# Patient Record
Sex: Female | Born: 1940 | Race: Black or African American | Hispanic: No | Marital: Single | Smoking: Former smoker
Health system: Southern US, Community
[De-identification: ages and names within clinical notes are randomized; demographics above are authoritative.]

## PROBLEM LIST (undated history)

## (undated) DIAGNOSIS — Z87448 Personal history of other diseases of urinary system: Secondary | ICD-10-CM

## (undated) DIAGNOSIS — R0602 Shortness of breath: Secondary | ICD-10-CM

## (undated) DIAGNOSIS — E785 Hyperlipidemia, unspecified: Secondary | ICD-10-CM

## (undated) DIAGNOSIS — C189 Malignant neoplasm of colon, unspecified: Secondary | ICD-10-CM

## (undated) DIAGNOSIS — Z8669 Personal history of other diseases of the nervous system and sense organs: Secondary | ICD-10-CM

## (undated) DIAGNOSIS — K635 Polyp of colon: Secondary | ICD-10-CM

## (undated) DIAGNOSIS — K529 Noninfective gastroenteritis and colitis, unspecified: Secondary | ICD-10-CM

## (undated) DIAGNOSIS — Z8709 Personal history of other diseases of the respiratory system: Secondary | ICD-10-CM

## (undated) DIAGNOSIS — I10 Essential (primary) hypertension: Secondary | ICD-10-CM

## (undated) DIAGNOSIS — B029 Zoster without complications: Secondary | ICD-10-CM

## (undated) DIAGNOSIS — C37 Malignant neoplasm of thymus: Secondary | ICD-10-CM

## (undated) HISTORY — DX: Personal history of other diseases of the nervous system and sense organs: Z86.69

## (undated) HISTORY — DX: Personal history of other diseases of urinary system: Z87.448

## (undated) HISTORY — DX: Personal history of other diseases of the respiratory system: Z87.09

## (undated) HISTORY — DX: Malignant neoplasm of colon, unspecified: C18.9

## (undated) HISTORY — DX: Polyp of colon: K63.5

## (undated) HISTORY — DX: Noninfective gastroenteritis and colitis, unspecified: K52.9

## (undated) HISTORY — DX: Essential (primary) hypertension: I10

## (undated) HISTORY — DX: Hyperlipidemia, unspecified: E78.5

## (undated) HISTORY — DX: Zoster without complications: B02.9

## (undated) HISTORY — DX: Malignant neoplasm of thymus: C37

---

## 1999-04-02 ENCOUNTER — Encounter: Admission: RE | Admit: 1999-04-02 | Discharge: 1999-04-02 | Payer: Self-pay | Admitting: Endocrinology

## 1999-05-25 ENCOUNTER — Encounter (INDEPENDENT_AMBULATORY_CARE_PROVIDER_SITE_OTHER): Payer: Self-pay | Admitting: *Deleted

## 1999-05-25 ENCOUNTER — Ambulatory Visit (HOSPITAL_COMMUNITY): Admission: RE | Admit: 1999-05-25 | Discharge: 1999-05-25 | Payer: Self-pay | Admitting: Gastroenterology

## 1999-05-28 ENCOUNTER — Encounter: Admission: RE | Admit: 1999-05-28 | Discharge: 1999-05-28 | Payer: Self-pay | Admitting: Gastroenterology

## 1999-05-28 ENCOUNTER — Encounter: Payer: Self-pay | Admitting: Gastroenterology

## 1999-06-21 HISTORY — PX: COLON SURGERY: SHX602

## 1999-06-21 HISTORY — PX: GALLBLADDER SURGERY: SHX652

## 1999-06-25 ENCOUNTER — Encounter: Payer: Self-pay | Admitting: Surgery

## 1999-06-29 ENCOUNTER — Encounter (INDEPENDENT_AMBULATORY_CARE_PROVIDER_SITE_OTHER): Payer: Self-pay | Admitting: Specialist

## 1999-06-29 ENCOUNTER — Inpatient Hospital Stay (HOSPITAL_COMMUNITY): Admission: RE | Admit: 1999-06-29 | Discharge: 1999-07-10 | Payer: Self-pay | Admitting: Surgery

## 1999-07-07 ENCOUNTER — Encounter: Payer: Self-pay | Admitting: Surgery

## 1999-07-08 ENCOUNTER — Encounter: Payer: Self-pay | Admitting: Surgery

## 2001-02-23 ENCOUNTER — Encounter: Payer: Self-pay | Admitting: Endocrinology

## 2001-02-23 ENCOUNTER — Encounter: Admission: RE | Admit: 2001-02-23 | Discharge: 2001-02-23 | Payer: Self-pay | Admitting: Sports Medicine

## 2002-05-15 ENCOUNTER — Encounter (INDEPENDENT_AMBULATORY_CARE_PROVIDER_SITE_OTHER): Payer: Self-pay | Admitting: Specialist

## 2002-05-15 ENCOUNTER — Ambulatory Visit (HOSPITAL_COMMUNITY): Admission: RE | Admit: 2002-05-15 | Discharge: 2002-05-15 | Payer: Self-pay | Admitting: Gastroenterology

## 2003-06-29 ENCOUNTER — Inpatient Hospital Stay (HOSPITAL_COMMUNITY): Admission: EM | Admit: 2003-06-29 | Discharge: 2003-07-01 | Payer: Self-pay | Admitting: Emergency Medicine

## 2005-08-15 ENCOUNTER — Ambulatory Visit (HOSPITAL_COMMUNITY): Admission: RE | Admit: 2005-08-15 | Discharge: 2005-08-15 | Payer: Self-pay | Admitting: Gastroenterology

## 2005-08-15 ENCOUNTER — Encounter (INDEPENDENT_AMBULATORY_CARE_PROVIDER_SITE_OTHER): Payer: Self-pay | Admitting: Specialist

## 2007-10-15 ENCOUNTER — Encounter: Admission: RE | Admit: 2007-10-15 | Discharge: 2007-10-15 | Payer: Self-pay | Admitting: Endocrinology

## 2009-04-17 ENCOUNTER — Ambulatory Visit: Payer: Self-pay | Admitting: Pulmonary Disease

## 2009-04-17 DIAGNOSIS — I10 Essential (primary) hypertension: Secondary | ICD-10-CM | POA: Insufficient documentation

## 2009-04-17 DIAGNOSIS — R0602 Shortness of breath: Secondary | ICD-10-CM

## 2009-05-12 ENCOUNTER — Ambulatory Visit: Payer: Self-pay | Admitting: Pulmonary Disease

## 2009-05-12 DIAGNOSIS — J439 Emphysema, unspecified: Secondary | ICD-10-CM

## 2009-05-18 ENCOUNTER — Encounter: Payer: Self-pay | Admitting: Pulmonary Disease

## 2009-06-15 ENCOUNTER — Encounter: Payer: Self-pay | Admitting: Pulmonary Disease

## 2009-09-04 ENCOUNTER — Telehealth (INDEPENDENT_AMBULATORY_CARE_PROVIDER_SITE_OTHER): Payer: Self-pay | Admitting: *Deleted

## 2009-09-15 ENCOUNTER — Encounter (HOSPITAL_COMMUNITY): Admission: RE | Admit: 2009-09-15 | Discharge: 2009-12-17 | Payer: Self-pay | Admitting: Pulmonary Disease

## 2009-09-17 ENCOUNTER — Ambulatory Visit: Payer: Self-pay | Admitting: Pulmonary Disease

## 2009-09-23 ENCOUNTER — Encounter: Payer: Self-pay | Admitting: Pulmonary Disease

## 2009-11-24 ENCOUNTER — Encounter: Payer: Self-pay | Admitting: Pulmonary Disease

## 2009-12-16 ENCOUNTER — Encounter: Payer: Self-pay | Admitting: Pulmonary Disease

## 2009-12-22 ENCOUNTER — Encounter (HOSPITAL_COMMUNITY): Admission: RE | Admit: 2009-12-22 | Discharge: 2010-03-19 | Payer: Self-pay | Admitting: Pulmonary Disease

## 2010-02-09 ENCOUNTER — Encounter: Payer: Self-pay | Admitting: Pulmonary Disease

## 2010-02-26 ENCOUNTER — Encounter: Admission: RE | Admit: 2010-02-26 | Discharge: 2010-02-26 | Payer: Self-pay | Admitting: Endocrinology

## 2010-03-10 ENCOUNTER — Encounter (INDEPENDENT_AMBULATORY_CARE_PROVIDER_SITE_OTHER): Payer: Self-pay | Admitting: *Deleted

## 2010-03-10 DIAGNOSIS — R509 Fever, unspecified: Secondary | ICD-10-CM | POA: Insufficient documentation

## 2010-03-12 ENCOUNTER — Encounter (INDEPENDENT_AMBULATORY_CARE_PROVIDER_SITE_OTHER): Payer: Self-pay | Admitting: *Deleted

## 2010-03-15 ENCOUNTER — Encounter: Payer: Self-pay | Admitting: Internal Medicine

## 2010-03-15 DIAGNOSIS — H409 Unspecified glaucoma: Secondary | ICD-10-CM | POA: Insufficient documentation

## 2010-03-15 DIAGNOSIS — Z9089 Acquired absence of other organs: Secondary | ICD-10-CM | POA: Insufficient documentation

## 2010-03-15 DIAGNOSIS — Z860100 Personal history of colon polyps, unspecified: Secondary | ICD-10-CM | POA: Insufficient documentation

## 2010-03-15 DIAGNOSIS — E785 Hyperlipidemia, unspecified: Secondary | ICD-10-CM

## 2010-03-15 DIAGNOSIS — Z85038 Personal history of other malignant neoplasm of large intestine: Secondary | ICD-10-CM | POA: Insufficient documentation

## 2010-03-15 LAB — CONVERTED CEMR LAB
AST: 24 units/L (ref 0–37)
Albumin: 3.8 g/dL (ref 3.5–5.2)
Alkaline Phosphatase: 76 units/L (ref 39–117)
Basophils Absolute: 0.1 10*3/uL (ref 0.0–0.1)
Basophils Relative: 0 % (ref 0–1)
Bilirubin Urine: NEGATIVE
Casts: NONE SEEN /lpf
Hemoglobin, Urine: NEGATIVE
MCHC: 33 g/dL (ref 30.0–36.0)
Monocytes Relative: 9 % (ref 3–12)
Neutro Abs: 10.5 10*3/uL — ABNORMAL HIGH (ref 1.7–7.7)
Neutrophils Relative %: 77 % (ref 43–77)
Potassium: 3.4 meq/L — ABNORMAL LOW (ref 3.5–5.3)
Protein, ur: NEGATIVE mg/dL
RBC: 4.24 M/uL (ref 3.87–5.11)
RDW: 12.3 % (ref 11.5–15.5)
Sed Rate: 66 mm/hr — ABNORMAL HIGH (ref 0–22)
Sodium: 137 meq/L (ref 135–145)
Total Protein: 7.1 g/dL (ref 6.0–8.3)
Urine Glucose: NEGATIVE mg/dL
WBC, UA: >50 cells/hpf — AB (ref <3)

## 2010-03-18 ENCOUNTER — Telehealth: Payer: Self-pay | Admitting: Internal Medicine

## 2010-03-18 DIAGNOSIS — K5289 Other specified noninfective gastroenteritis and colitis: Secondary | ICD-10-CM

## 2010-03-26 ENCOUNTER — Ambulatory Visit: Payer: Self-pay | Admitting: Pulmonary Disease

## 2010-03-31 ENCOUNTER — Telehealth: Payer: Self-pay | Admitting: Internal Medicine

## 2010-04-15 ENCOUNTER — Telehealth (INDEPENDENT_AMBULATORY_CARE_PROVIDER_SITE_OTHER): Payer: Self-pay | Admitting: *Deleted

## 2010-04-28 ENCOUNTER — Encounter: Payer: Self-pay | Admitting: Internal Medicine

## 2010-04-29 ENCOUNTER — Inpatient Hospital Stay (HOSPITAL_COMMUNITY): Admission: EM | Admit: 2010-04-29 | Discharge: 2010-05-14 | Payer: Self-pay | Admitting: Internal Medicine

## 2010-04-29 ENCOUNTER — Ambulatory Visit: Payer: Self-pay | Admitting: Internal Medicine

## 2010-04-29 ENCOUNTER — Encounter: Payer: Self-pay | Admitting: Internal Medicine

## 2010-04-29 DIAGNOSIS — R634 Abnormal weight loss: Secondary | ICD-10-CM

## 2010-04-29 DIAGNOSIS — R1310 Dysphagia, unspecified: Secondary | ICD-10-CM | POA: Insufficient documentation

## 2010-05-04 ENCOUNTER — Encounter: Payer: Self-pay | Admitting: Internal Medicine

## 2010-05-04 ENCOUNTER — Ambulatory Visit: Payer: Self-pay | Admitting: Cardiothoracic Surgery

## 2010-05-07 ENCOUNTER — Ambulatory Visit: Payer: Self-pay | Admitting: Hematology & Oncology

## 2010-05-10 ENCOUNTER — Encounter: Payer: Self-pay | Admitting: Internal Medicine

## 2010-05-10 HISTORY — PX: OTHER SURGICAL HISTORY: SHX169

## 2010-05-21 ENCOUNTER — Ambulatory Visit: Payer: Self-pay | Admitting: Cardiothoracic Surgery

## 2010-05-25 ENCOUNTER — Ambulatory Visit
Admission: RE | Admit: 2010-05-25 | Discharge: 2010-07-20 | Payer: Self-pay | Source: Home / Self Care | Attending: Radiation Oncology | Admitting: Radiation Oncology

## 2010-05-27 ENCOUNTER — Encounter: Payer: Self-pay | Admitting: Internal Medicine

## 2010-05-27 ENCOUNTER — Encounter
Admission: RE | Admit: 2010-05-27 | Discharge: 2010-05-27 | Payer: Self-pay | Source: Home / Self Care | Admitting: Cardiothoracic Surgery

## 2010-05-27 ENCOUNTER — Ambulatory Visit: Payer: Self-pay | Admitting: Cardiothoracic Surgery

## 2010-07-02 ENCOUNTER — Ambulatory Visit: Payer: Self-pay | Admitting: Hematology & Oncology

## 2010-07-07 ENCOUNTER — Telehealth (INDEPENDENT_AMBULATORY_CARE_PROVIDER_SITE_OTHER): Payer: Self-pay | Admitting: *Deleted

## 2010-07-09 ENCOUNTER — Encounter
Admission: RE | Admit: 2010-07-09 | Discharge: 2010-07-09 | Payer: Self-pay | Source: Home / Self Care | Attending: Cardiothoracic Surgery | Admitting: Cardiothoracic Surgery

## 2010-07-09 ENCOUNTER — Ambulatory Visit
Admission: RE | Admit: 2010-07-09 | Discharge: 2010-07-09 | Payer: Self-pay | Source: Home / Self Care | Attending: Cardiothoracic Surgery | Admitting: Cardiothoracic Surgery

## 2010-07-10 ENCOUNTER — Other Ambulatory Visit: Payer: Self-pay | Admitting: Hematology & Oncology

## 2010-07-10 DIAGNOSIS — C73 Malignant neoplasm of thyroid gland: Secondary | ICD-10-CM

## 2010-07-10 NOTE — Assessment & Plan Note (Addendum)
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  The patient is currently undergoing radiation therapy and followup to her thymic carcinoma.  She has been somewhat weak during this.  PHYSICAL EXAMINATION:  Blood pressure today is 88/68.  When she first walked into the office, the heart rate was 140, dropped to 116.  EKG rhythm strip showed sinus rhythm.  Respiratory rate 16.  O2 sats 97%. The patient remains somewhat weak, notes that she has not been eating much, but continues to take her medication.  Her partial sternotomy incision is well healed without evidence of infection.  She has no evidence of DVT or pedal edema.  IMPRESSION:  I have reviewed her medication.  She did see Dr. Roselind Messier in Radiation and started on Megace to help her appetite.  With her low blood pressure, she continues on verapamil SR 120 mg a day.  She has a blood pressure cuff she uses at home and I have told her that if blood pressure is less than 90, not to take her verapamil.  She is to make an appointment to see Dr. Lucianne Muss to review all of her medications.  She does have a history of renal insufficiency and with her low blood pressure probably needs medications adjusted.  I plan to see her back at the completion of her radiation therapy after she has a followup CT scan by Dr. Myna Hidalgo.  Sheliah Plane, MD Electronically Signed  EG/MEDQ  D:  07/09/2010  T:  07/10/2010  Job:  [id number]  cc:   [name], MD Rose Phi. Myna Hidalgo, M.D.

## 2010-07-18 ENCOUNTER — Emergency Department (HOSPITAL_COMMUNITY)
Admission: EM | Admit: 2010-07-18 | Discharge: 2010-07-19 | Payer: Self-pay | Source: Home / Self Care | Admitting: Emergency Medicine

## 2010-07-19 LAB — URINALYSIS, ROUTINE W REFLEX MICROSCOPIC
Leukocytes, UA: NEGATIVE
Nitrite: NEGATIVE
Specific Gravity, Urine: 1.019 (ref 1.005–1.030)
Urobilinogen, UA: 1 mg/dL (ref 0.0–1.0)
pH: 6 (ref 5.0–8.0)

## 2010-07-19 LAB — DIFFERENTIAL
Basophils Absolute: 0 10*3/uL (ref 0.0–0.1)
Basophils Relative: 0 % (ref 0–1)
Eosinophils Absolute: 0.2 10*3/uL (ref 0.0–0.7)
Monocytes Absolute: 0.5 10*3/uL (ref 0.1–1.0)
Neutro Abs: 11.4 10*3/uL — ABNORMAL HIGH (ref 1.7–7.7)
Neutrophils Relative %: 90 % — ABNORMAL HIGH (ref 43–77)

## 2010-07-19 LAB — BASIC METABOLIC PANEL
CO2: 22 mEq/L (ref 19–32)
Calcium: 9 mg/dL (ref 8.4–10.5)
Chloride: 99 mEq/L (ref 96–112)
Creatinine, Ser: 2.58 mg/dL — ABNORMAL HIGH (ref 0.4–1.2)
GFR calc Af Amer: 22 mL/min — ABNORMAL LOW (ref >60)
Sodium: 134 mEq/L — ABNORMAL LOW (ref 135–145)

## 2010-07-19 LAB — POCT I-STAT, CHEM 8
Chloride: 100 mEq/L (ref 96–112)
Glucose, Bld: 100 mg/dL — ABNORMAL HIGH (ref 70–99)
HCT: 30 % — ABNORMAL LOW (ref 36.0–46.0)
Hemoglobin: 10.2 g/dL — ABNORMAL LOW (ref 12.0–15.0)
Potassium: 3.3 mEq/L — ABNORMAL LOW (ref 3.5–5.1)
Sodium: 133 mEq/L — ABNORMAL LOW (ref 135–145)

## 2010-07-19 LAB — CBC
Hemoglobin: 10.3 g/dL — ABNORMAL LOW (ref 12.0–15.0)
MCHC: 33.3 g/dL (ref 30.0–36.0)
Platelets: 481 10*3/uL — ABNORMAL HIGH (ref 150–400)

## 2010-07-19 LAB — URINE MICROSCOPIC-ADD ON

## 2010-07-20 ENCOUNTER — Inpatient Hospital Stay (HOSPITAL_COMMUNITY)
Admission: EM | Admit: 2010-07-20 | Discharge: 2010-07-29 | DRG: 644 | Disposition: A | Discharge status: Home / Self Care | Payer: Medicaid Other | Attending: Family Medicine | Admitting: Family Medicine

## 2010-07-20 DIAGNOSIS — K5190 Ulcerative colitis, unspecified, without complications: Secondary | ICD-10-CM | POA: Diagnosis present

## 2010-07-20 DIAGNOSIS — D631 Anemia in chronic kidney disease: Secondary | ICD-10-CM | POA: Diagnosis present

## 2010-07-20 DIAGNOSIS — J4489 Other specified chronic obstructive pulmonary disease: Secondary | ICD-10-CM | POA: Diagnosis present

## 2010-07-20 DIAGNOSIS — R197 Diarrhea, unspecified: Secondary | ICD-10-CM | POA: Diagnosis not present

## 2010-07-20 DIAGNOSIS — I959 Hypotension, unspecified: Secondary | ICD-10-CM | POA: Diagnosis present

## 2010-07-20 DIAGNOSIS — E785 Hyperlipidemia, unspecified: Secondary | ICD-10-CM | POA: Diagnosis present

## 2010-07-20 DIAGNOSIS — E872 Acidosis, unspecified: Secondary | ICD-10-CM | POA: Diagnosis not present

## 2010-07-20 DIAGNOSIS — T380X5A Adverse effect of glucocorticoids and synthetic analogues, initial encounter: Secondary | ICD-10-CM | POA: Diagnosis present

## 2010-07-20 DIAGNOSIS — E860 Dehydration: Secondary | ICD-10-CM | POA: Diagnosis present

## 2010-07-20 DIAGNOSIS — I498 Other specified cardiac arrhythmias: Secondary | ICD-10-CM | POA: Diagnosis present

## 2010-07-20 DIAGNOSIS — E041 Nontoxic single thyroid nodule: Secondary | ICD-10-CM | POA: Diagnosis present

## 2010-07-20 DIAGNOSIS — Z79899 Other long term (current) drug therapy: Secondary | ICD-10-CM

## 2010-07-20 DIAGNOSIS — E876 Hypokalemia: Secondary | ICD-10-CM | POA: Diagnosis present

## 2010-07-20 DIAGNOSIS — K5900 Constipation, unspecified: Secondary | ICD-10-CM | POA: Diagnosis present

## 2010-07-20 DIAGNOSIS — F29 Unspecified psychosis not due to a substance or known physiological condition: Secondary | ICD-10-CM | POA: Diagnosis not present

## 2010-07-20 DIAGNOSIS — Z85038 Personal history of other malignant neoplasm of large intestine: Secondary | ICD-10-CM

## 2010-07-20 DIAGNOSIS — Z9049 Acquired absence of other specified parts of digestive tract: Secondary | ICD-10-CM

## 2010-07-20 DIAGNOSIS — N179 Acute kidney failure, unspecified: Secondary | ICD-10-CM | POA: Diagnosis present

## 2010-07-20 DIAGNOSIS — N390 Urinary tract infection, site not specified: Secondary | ICD-10-CM | POA: Diagnosis not present

## 2010-07-20 DIAGNOSIS — Y842 Radiological procedure and radiotherapy as the cause of abnormal reaction of the patient, or of later complication, without mention of misadventure at the time of the procedure: Secondary | ICD-10-CM | POA: Diagnosis present

## 2010-07-20 DIAGNOSIS — I2489 Other forms of acute ischemic heart disease: Secondary | ICD-10-CM | POA: Diagnosis not present

## 2010-07-20 DIAGNOSIS — I248 Other forms of acute ischemic heart disease: Secondary | ICD-10-CM | POA: Diagnosis not present

## 2010-07-20 DIAGNOSIS — D509 Iron deficiency anemia, unspecified: Secondary | ICD-10-CM | POA: Diagnosis present

## 2010-07-20 DIAGNOSIS — R5381 Other malaise: Secondary | ICD-10-CM | POA: Diagnosis not present

## 2010-07-20 DIAGNOSIS — I129 Hypertensive chronic kidney disease with stage 1 through stage 4 chronic kidney disease, or unspecified chronic kidney disease: Secondary | ICD-10-CM | POA: Diagnosis present

## 2010-07-20 DIAGNOSIS — E2749 Other adrenocortical insufficiency: Principal | ICD-10-CM | POA: Diagnosis present

## 2010-07-20 DIAGNOSIS — Z87891 Personal history of nicotine dependence: Secondary | ICD-10-CM

## 2010-07-20 DIAGNOSIS — R627 Adult failure to thrive: Secondary | ICD-10-CM | POA: Diagnosis present

## 2010-07-20 DIAGNOSIS — J449 Chronic obstructive pulmonary disease, unspecified: Secondary | ICD-10-CM | POA: Diagnosis present

## 2010-07-20 DIAGNOSIS — C37 Malignant neoplasm of thymus: Secondary | ICD-10-CM | POA: Diagnosis present

## 2010-07-20 DIAGNOSIS — N183 Chronic kidney disease, stage 3 unspecified: Secondary | ICD-10-CM | POA: Diagnosis present

## 2010-07-20 HISTORY — DX: Shortness of breath: R06.02

## 2010-07-20 LAB — URINE CULTURE
Culture  Setup Time: 201201301142
Culture: NO GROWTH

## 2010-07-20 LAB — CBC
Hemoglobin: 12.9 g/dL (ref 12.0–15.0)
MCHC: 33.7 g/dL (ref 30.0–36.0)
RDW: 15.9 % — ABNORMAL HIGH (ref 11.5–15.5)
WBC: 9.8 10*3/uL (ref 4.0–10.5)

## 2010-07-20 LAB — DIFFERENTIAL
Basophils Absolute: 0 10*3/uL (ref 0.0–0.1)
Basophils Relative: 0 % (ref 0–1)
Lymphocytes Relative: 5 % — ABNORMAL LOW (ref 12–46)
Monocytes Absolute: 0.4 10*3/uL (ref 0.1–1.0)
Neutro Abs: 8.8 10*3/uL — ABNORMAL HIGH (ref 1.7–7.7)

## 2010-07-20 LAB — COMPREHENSIVE METABOLIC PANEL
Albumin: 2.4 g/dL — ABNORMAL LOW (ref 3.5–5.2)
Alkaline Phosphatase: 71 U/L (ref 39–117)
BUN: 21 mg/dL (ref 6–23)
CO2: 22 mEq/L (ref 19–32)
Chloride: 98 mEq/L (ref 96–112)
Creatinine, Ser: 2.19 mg/dL — ABNORMAL HIGH (ref 0.4–1.2)
GFR calc non Af Amer: 22 mL/min — ABNORMAL LOW (ref >60)
Glucose, Bld: 110 mg/dL — ABNORMAL HIGH (ref 70–99)
Potassium: 2.9 mEq/L — ABNORMAL LOW (ref 3.5–5.1)
Total Bilirubin: 0.8 mg/dL (ref 0.3–1.2)

## 2010-07-20 NOTE — Progress Notes (Signed)
Summary: Periodic fevers continue, appt. scheduled 04/29/10  Phone Note Call from Patient Call back at Home Phone [phone]   Caller: Patient Call For: Dewayne Shorter, MD Reason for Call: Acute Illness, Talk to Nurse Action Taken: Phone Call Completed, Appt Scheduled Summary of Call: Pt. continuing to have elevated fevers several times a week up to 102.2.  She does take Tylenol when this happens.  RN asked to to keep a diary of these fevers and to bring it to the next appt. on 04/29/10 @ 11:30.  She said that she would try to keep this record. [name] RN  April 15, 2010 3:36 PM

## 2010-07-20 NOTE — Miscellaneous (Signed)
Summary: Spiriva shipment arrived in mail  Clinical Lists Changes    pt's 90 day spiriva shipment arrived from pt assistance program. spoke wiht pt's family member and informed her meds left at front desk for pt to pick up.  family member verbalized understanding and stated she will relay message to pt.  Arman Filter LPN  November 24, 6008 2:37 PM

## 2010-07-20 NOTE — Miscellaneous (Signed)
Summary: shipment of Spiriva   Clinical Lists Changes   received shipment from [employer] on pt's Spiriva- 90 day supply.  pt aware med left at front desk for pt to pick up.  Aundra Millet Reynolds LPN  February 09, 2010 3:06 PM

## 2010-07-20 NOTE — Miscellaneous (Signed)
Summary: HIPAA Restrictions  HIPAA Restrictions   Imported By: Florinda Marker 05/03/2010 15:26:06  _____________________________________________________________________  External Attachment:    Type:   Image     Comment:   External Document

## 2010-07-20 NOTE — H&P (Signed)
[name], TRUXILLO NO.:  [id number]  MEDICAL RECORD NO.:  [id number]          PATIENT TYPE:  EMS  LOCATION:  ED                           FACILITY:  [facility]  PHYSICIAN:  Andreas Blower, MD       DATE OF BIRTH:  [birth year]  DATE OF ADMISSION:  07/20/2010 DATE OF DISCHARGE:                             HISTORY & PHYSICAL   PRIMARY CARE PHYSICIAN:  Dr. Lucianne Muss.  GASTROENTEROLOGIST:  Anselmo Rod, MD, [facility].  THORACIC SURGEON:  Sheliah Plane, M.D.  NEPHROLOGIST:  Terrial Rhodes, M.D.  HEMATOLOGIST/ONCOLOGIST:  Rose Phi. Myna Hidalgo, M.D.  CHIEF COMPLAINT:  Weakness, abdominal pain.  HISTORY OF PRESENT ILLNESS:  Ms. [name] is a 70 year old African American female with a history of chronic kidney disease, thymic carcinoma status post resection in November 2011, hypertension, hyperlipidemia, anemia, thyroid nodule, ulcerative colitis who presents with the above complaints.  The patient reports that she had been receiving radiation therapy and subsequently since then has been progressively getting weaker.  She presented yesterday with lower quadrant abdominal pain.  She had x-rays at that time, which showed that she had normal bowel gas pattern, and it was thought that she had constipation and was given Fleet enema and magnesium citrate with improvement in her symptoms.  She presented back today reporting that still feeling constipated however and also having some mild lower abdomen discomfort.  When I evaluated the patient, she denies any abdominal pain and was eating her breakfast.  She denies any recent fevers, chills, nausea or vomiting.  Denies any chest pain.  Denies any shortness of breath presently.  Denies any abdominal pain.  Denies any headaches or vision changes.  REVIEW OF SYSTEMS:  All systems were reviewed with the patient, were positive as per HPI, otherwise all other systems are negative.  PAST MEDICAL HISTORY: 1. History of COPD. 2.  Hypertension. 3. 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 4. Chronic kidney disease stage 3. 5. Hypokalemia. 6. Hypertension. 7. Hyperlipidemia. 8. History of ulcerative colitis. 9. History of thyroid nodule. 10.Anemia of chronic disease. 11.History of colon cancer status post hemicolectomy in January 2012. 12.History of hypomagnesemia. 13.History of the distal esophageal stricture status post dilatation     on May 06, 2011.  SOCIAL HISTORY:  The patient does not smoke at this time, quit few years ago, is unsure how long she smoked for.  Drinks a beer once or twice a week. Denies any illegal drugs or substances.  Lives at home.  FAMILY HISTORY:  Significant for diabetes.  HOME MEDICATIONS: 1. Triamterene and hydrochlorothiazide 37.5/25 mg p.o. daily. 2. Pravastatin 40 mg daily. 3. Potassium chloride 20 mEq p.o. daily. 4. Align 4 mg p.o. daily. 5. Clonazepam 0.5 mg every 6 hours as needed for anxiety. 6. Mesalamine DR 1.2 g p.o. daily. 7. Timolol ophthalmic solution 0.5% 1 drop both eyes twice daily. 8. Albuterol inhaler 1-2 puffs every 4 hours as needed. 9. Symbicort 160/4.5 mcg 2 puffs twice daily. 10.Spiriva 18 mcg p.o. daily. 11.Pantoprazole 40 mg p.o. daily 12.Megestrol 40 mg p.o. daily. 13.Verapamil extended release 120 p.o. daily.  PHYSICAL EXAMINATION:  VITAL  SIGNS:  Temperature is 97.6, blood pressure is 121/73, heart rate 109 and on EKG was 101, respirations 16, satting at 100%on 2 L of oxygen. GENERAL:  The patient was alert and oriented, did not appear to be in any acute distress, was laying in bed comfortably. HEENT:  Extraocular motions are intact.  Pupils equal and round.  Had slightly dry mucous membranes. NECK:  Supple. HEART:  Regular with S1 and S2. LUNGS:  Clear to auscultation bilaterally. ABDOMEN:  Soft, nontender, nondistended.  Positive bowel sounds. EXTREMITIES:  The patient had good peripheral pulses with trace  edema. NEUROLOGIC:  Cranial nerves II-XII grossly intact.  Had 5/5 motor strength in upper as well as lower extremities.  RADIOLOGY/IMAGING: 1. The patient had abdominal x-ray on July 19, 2010, which showed     normal bowel gas pattern status post cholecystectomy, postoperative     changes of median sternotomy. 2. The patient had EKG which showed sinus tachycardia.  LABORATORY DATA:  CBC shows a white count of 9.8, hemoglobin 12.8, hematocrit 38.3, platelet count 437.  Electrolytes showed sodium of 132, potassium 2.8, chloride 98, CO2 22, BUN 21, creatinine 2.19.  Liver function tests normal except albumin is 2.4.  ASSESSMENT AND PLAN: 1. Acute renal failure on chronic kidney disease II, most likely     secondary to dehydration, improved with hydration from yesterday     today.  Continue IV hydration.  Reassess her renal function     tomorrow.  If not improved, consider getting renal ultrasound.  At     that time had a UA yesterday, which was negative for nitrites and     leukocytes. 2. Dehydration.  Continue IV fluids.  Most likely secondary to poor     p.o. intake. 3. Constipation.  The patient was started on a bowel regimen.  Has     MiraLax, docusates and we will also have her on p.r.n. Dulcolax     suppository as needed. 4. Tachycardia, most likely secondary to dehydration, improved with IV     hydration.  Continue IV hydration. 5. Thymic carcinoma status post resection.  The patient to continue to     receive radiation therapy. 6. Hypokalemia most likely secondary to poor p.o. intake.  Replace as     needed. 7. Failure to thrive, poor p.o. intake.  The patient currently on     Megace.  We will continue for now.  We will encourage p.o. intake,     and we will get nutrition consult for foods that she can take.  We     will also have her on Ensure. 8. Hypertension.  Holding triamterene and hydrochlorothiazide given     the patient has dehydration.  Continue verapamil with  hold     parameters. 9. Hyperlipidemia.  Continue statin. 10.History of ulcerative colitis.  Continue mesalamine. 11.Anemia of chronic disease.  Hemoglobin is stable at this     time, is not anemic at this time.  I suspect that the patient may     be hemoconcentrated from dehydration. 12.History of thyroid nodule, not an active issue at this time.     Continue outpatient management as per her primary care physician. 13.Code status.  The patient is full code.  This was discussed with     the patient and the patient's family at the time of admission. 14.Disposition.  If her renal function improves in the next 28-48     hours, consider discharging the patient at this time after  she has     a PT evaluation and if she feels better.  Time spent on admission, talking to the patient, talking to the patient's family and coordinating care was 1 hour.   Andreas Blower, MD   SR/MEDQ  D:  07/20/2010  T:  07/20/2010  Job:  [id number]  Electronically Signed by Wardell Heath Brean Carberry  on 07/20/2010 11:34:10 PM

## 2010-07-20 NOTE — Progress Notes (Signed)
Summary: samples  Phone Note Call from Patient   Caller: Patient Call For: clance Summary of Call: pt would like to know if she should continue using spiriva . she had samples only Initial call taken by: Rickard Patience,  September 04, 2009 1:54 PM  Follow-up for Phone Call        called and spoke with pt.  informed pt per last ov with KC on 05-12-2010, pt is to stay on Spiriva.   pt does need 4 month f/u appt with kc.  pt scheduled this today for 09-17-2009 at 2:30pm.  Arman Filter LPN  September 04, 2009 2:18 PM

## 2010-07-20 NOTE — Assessment & Plan Note (Signed)
Summary: f/u cont. fevers /dde  Current Problems:   WEIGHT LOSS, ABNORMAL (ICD-783.21) DYSPHAGIA UNSPECIFIED (ICD-787.20) FEVER UNSPECIFIED (ICD-780.60) HYPERLIPIDEMIA (ICD-272.4) EMPHYSEMA (ICD-492.8)   DYSPNEA (ICD-786.05) HYPERTENSION (ICD-401.9) COLITIS (ICD-558.9) GLAUCOMA (ICD-365.9) COLONIC POLYPS, HX OF (ICD-V12.72) COLON CANCER, HX OF (ICD-V10.05)   * LEFT HEMICOLECTOMY CHOLECYSTECTOMY, HX OF (ICD-V45.79)   Referring Provider:  Self Referral Primary Provider:  Dr. Lucianne Muss  CC:  has lost weight, having difficulty swallowing, feels like she gags when she tries to eat.  No appetite.  Ensure doesn't appeal to her, nauseated most of the time, and restless legs and cramping at night.  History of Present Illness: [name] is in for her follow-up visit.  I spoke with her after her last visit 6 weeks ago and she informed me that she was still having daily fevers up to 102.  She says that shehasis continued to monitor her temperatures and does not believe that she's had any fever or in the last two weeks. She has not had any chills or sweats.  She has however developed difficulty swallowing over the past two weeks.  She has never had this problem before.  She describes difficulty with solid food and pills which will cause her to choke.  She will occasionally notice a choking sensation when she tries to rinse her mouth out with water but primarily has difficulty with solids.  She has no pain with swallowing.  She will occasionally develop some nausea and vomit when she is trying to swallow and chokes.  She has continued to lose weight.  She has not had any new cough but thinks that she may have a little bit more dyspnea on exertion than baseline.  She has not had any problem with acid indigestion, abdominal pain, diarrhea or constipation.  She has not had any dysuria.  She has not noted any rash or itching.  She's not had any new dental problems.  Preventive Screening-Counseling &  Management  Alcohol-Tobacco     Alcohol drinks/day: 0     Smoking Status: quit     Year Quit: 2010  Caffeine-Diet-Exercise     Caffeine use/day: tea     Does Patient Exercise: no  Safety-Violence-Falls     Seat Belt Use: yes   Updated Prior Medication List: SPIRIVA HANDIHALER 18 MCG CAPS (TIOTROPIUM BROMIDE MONOHYDRATE) inhale contents of 1 capsule daily SYMBICORT 160-4.5 MCG/ACT AERO (BUDESONIDE-FORMOTEROL FUMARATE) 2 puffs two times a day PROAIR HFA 108 (90 BASE) MCG/ACT  AERS (ALBUTEROL SULFATE) 1-2 puffs every 4-6 hours as needed VERAPAMIL HCL CR 120 MG CR-TABS (VERAPAMIL HCL) Take 1 tablet by mouth once a day TRIAMTERENE-HCTZ 37.5-25 MG TABS (TRIAMTERENE-HCTZ) 1/2 tab by mouth once daily PRAVASTATIN SODIUM 40 MG TABS (PRAVASTATIN SODIUM) 1 once daily TIMOLOL MALEATE 0.5 % SOLN (TIMOLOL MALEATE) 1 drop in each eye two times a day LIALDA 1.2 GM TBEC (MESALAMINE) Take 1 tablet by mouth once a day CLONAZEPAM 0.5 MG TABS (CLONAZEPAM) take by mouth as needed PRILOSEC OTC 20 MG TBEC (OMEPRAZOLE MAGNESIUM) Take 1 tablet by mouth once a day * OTC ALLERGY TABLET   Current Allergies (reviewed today): No known allergies  Additional History Menstrual Status:  postmenopausal  Review of Systems       The patient complains of weight loss, hoarseness, and dyspnea on exertion.  The patient denies anorexia, fever, chest pain, prolonged cough, headaches, abdominal pain, severe indigestion/heartburn, suspicious skin lesions, and difficulty walking.    Vital Signs:  Patient profile:   70 year old  female Menstrual status:  postmenopausal Height:      66 inches (167.64 cm) Weight:      131.5 pounds (59.77 kg) BMI:     21.30 Temp:     98.0 degrees F (36.67 degrees C) oral Pulse rate:   134 / minute BP sitting:   123 / 81  (left arm) Cuff size:   regular  Vitals Entered By: April Maduro RN (April 29, 2010 11:32 AM) CC: has lost weight, having difficulty swallowing, feels like she  gags when she tries to eat.  No appetite.  Ensure doesn't appeal to her, nauseated most of the time, restless legs and cramping at night Is Patient Diabetic? No Pain Assessment Patient in pain? no      Nutritional Status BMI of 19 -24 = normal Nutritional Status Detail appetite "I don't have one, even tablets get stuck in ther throat.  Have you ever been in a relationship where you felt threatened, hurt or afraid?not asked    Does patient need assistance? Functional Status Self care Ambulation Normal     Menstrual Status postmenopausal   Physical Exam  General:  alert, underweight appearing, and pale.  her voice is hoarse Eyes:  vision grossly intact, corneas and lenses clear, and no injection.   Mouth:  pharynx pink and moist, no erythema, no exudates, and fair dentition.   Neck:  supple, full ROM, and no masses.   Lungs:  normal breath sounds, no crackles, and no wheezes.   Heart:  normal rate, regular rhythm, and no murmur.   Abdomen:  soft, non-tender, normal bowel sounds, and no masses.   Msk:  normal ROM, no joint tenderness, no joint swelling, no joint warmth, and no redness over joints.   Skin:  no rashes and no suspicious lesions.   Cervical Nodes:  no anterior cervical adenopathy and no posterior cervical adenopathy.   Axillary Nodes:  no R axillary adenopathy and no L axillary adenopathy.   Psych:  normally interactive, good eye contact, not anxious appearing, and not depressed appearing.     Impression & Recommendations:  Problem # 1:  WEIGHT LOSS, ABNORMAL (ICD-783.21)  [name] he has lost over 30 pounds in the last 7 months without trying, 22 of those pounds been lost in the last few weeks.  Her weight loss predates her new onset dysphagia and seems out of proportion to what I would expect with this problem.  I am not sure how the weight loss relates to her recent fevers but I think it is best that we admit her to the hospital for further evaluation.  I have  spoken to her primary care doctor, Dr. Maryelizabeth Kaufmann. He is in agreement with this plan.  Although it seems that her fevers have abated in the last few weeks, this still may be part of afebrile illness.  She had pyuria at the time of her last visit without any symptoms of a UTI.  She also had mild eosinophilia with an absolute eosinophil count of 800.  Blood cultures in September were negative but these were obtained while taking Cipro.  She recalls that she did feel better while she was on Cipro suggesting that it may have been suppressing a bacterial infection.  Orders: Est. Patient Level IV (81191)  Problem # 2:  DYSPHAGIA UNSPECIFIED (ICD-787.20)  She has new onset dysphagia to solids without a history of acid reflux symptoms.  She has seen Dr. Loreta Ave in the past due to her history of  colon cancer, colon polyps and focal active colitis ( I do not know the full history for colitis). We will need to ask Dr. Loreta Ave to evaluate her in the hospital.  Orders: Est. Patient Level IV (16109)  Medications Added to Medication List This Visit: 1)  Prilosec Otc 20 Mg Tbec (Omeprazole magnesium) .... Take 1 tablet by mouth once a day 2)  Otc Allergy Tablet   Patient Instructions: 1)  She will be admitted to our Medical Teaching service 2)  She will need CBC with diff, CMP, UA, BC x 2, PA and lat CXR and a GI evaluation  Appended Document: f/u cont. fevers /dde   Influenza Vaccine    Vaccine Type: Fluvax Non-MCR    Site: right deltoid    Mfr: novartis    Dose: 0.5 ml    Route: IM    Given by: April Maduro RN    Exp. Date: 09/19/2010    Lot #: [id number]  Flu Vaccine Consent Questions    Do you have a history of severe allergic reactions to this vaccine? no    Any prior history of allergic reactions to egg and/or gelatin? no    Do you have a sensitivity to the preservative Thimersol? no    Do you have a past history of Guillan-Barre Syndrome? no    Do you currently have an acute febrile illness?  no    Have you ever had a severe reaction to latex? no    Vaccine information given and explained to patient? yes    Are you currently pregnant? no

## 2010-07-20 NOTE — Miscellaneous (Signed)
Summary: clinical list update  Clinical Lists Changes  Problems: Added new problem of FEVER UNSPECIFIED (ICD-780.60) - persistent

## 2010-07-20 NOTE — Miscellaneous (Signed)
Summary: Hospital Admission 04/29/10  04/29/10 - Hospital Admission Provider: Deatra Robinson MD  INTERNAL MEDICINE ADMISSION HISTORY AND PHYSICAL  PCP: Dr. Lucianne Muss Attending: Dr. Mariea Stable First Contact: Christin Gethers MS IV [phone]) Second Contact: Dr. Denton Meek ([phone])  CC: dysphagia  HPI: [name] is a 70 yo woman with PMHx of UC, colon cancer s/p L hemicolectomy, and emphysema  who presented to Dr. Blair Dolphin clinic today for a follow up appointe regarding FUO with chief complaints of dysphagia and weight loss.  Approximately 6 weeks ago ago the patient began to lose her appetite and experienced some associated stomach pain.  As a result the patient stayed at home, over time the stomach pain resolved. However, she never regained her appetite.  Throughout this time period, the patient began experiencing daily fevers of approximately 102 degrees.  The patient denies having fevers over the past 2 weeks.  Unfortunately over this 2 week period the patient has noticed difficulty swallowing.  She describes the dysphagia as uncomfortable more than painful.  She experiences difficulty with both liquids and solids. She states that liquids are able to stay down  The patient has regurgitated her food on several occasions. On occasions while attempting to eat food, she feels like she's choking.  As a result the patient says she has been able to eat very little, mostly she eats jello and soup.    She denies cough, fever, heartburn, chills, hematemesis, hematochezia or chest pain.  She also does not endorse diarrhea or constipation. As previously stated patient also complains of loss of appetite.  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) HTN HLD Hx of Colon Cancer (left hemicolectomy - '01) Hx of Colon polyps Emphysema Glaucoma Cholecystectomy (2001) MEDICATIONS: Current Meds:  SPIRIVA HANDIHALER 18 MCG CAPS  (TIOTROPIUM BROMIDE MONOHYDRATE)  SYMBICORT 160-4.5 MCG/ACT AERO (BUDESONIDE-FORMOTEROL FUMARATE)  PROAIR HFA 108 (90 BASE) MCG/ACT  AERS (ALBUTEROL SULFATE)  VERAPAMIL HCL CR 120 MG CR-TABS (VERAPAMIL HCL) TRIAMTERENE-HCTZ 37.5-25 MG TABS (TRIAMTERENE-HCTZ) PRAVASTATIN SODIUM 40 MG TABS (PRAVASTATIN SODIUM)  TIMOLOL MALEATE 0.5 % SOLN (TIMOLOL MALEATE) day LIALDA 1.2 GM TBEC (MESALAMINE)  CLONAZEPAM 0.5 MG TABS (CLONAZEPAM) PRILOSEC OTC 20 MG TBEC (OMEPRAZOLE MAGNESIUM)   SOCIAL HISTORY: - former smoker.  started at age 42.  less than 1 ppd. quit 2010 - single.  - no children. - currently unemployed; previously worked in Producer, television/film/video - lives with her mother - denies EtOH or illicit drug use  FAMILY HISTORY mother: DM, arthritis, heart dz cancer: father (colon)  ROS: per HPI  VITALS: T: 97.2  P:130  BP: 105/73  R: 18  O2SAT:98  ON: Room Air  PHYSICAL EXAM: General:  alert, pale, sallow, fragile Head:  normocephalic and atraumatic.  Eyes:  vision grossly intact, pupils equal, pupils round, pupils reactive to light, no injection and anicteric.  Mouth:  MMM Neck:  supple, no JVD Lungs:  CTAB, no wheezes or crackles CV: tachycardia, no M/R/G, Abdomen: hypoactive bowel sounds, no masses, soft, TTP in LUQ and LLQ MSK: no joint swelling Neurologic:  alert & oriented X3, cranial nerves II-XII intact Skin:  turgor normal and no rashes.  Psych:  Oriented X3, appropriate affect  LABS: CBC  WBC                                      11.4       h  4.0-10.5         K/uL  RBC                                      4.23              3.87-5.11        MIL/uL  Hemoglobin (HGB)                         12.1              12.0-15.0        g/dL  Hematocrit (HCT)                         36.5              36.0-46.0        %  MCV                                      86.3              78.0-100.0       fL  MCH -                                    28.6              26.0-34.0        pg  MCHC                                      33.2              30.0-36.0        g/dL  RDW                                      12.4              11.5-15.5        %  Platelet Count (PLT)                     447        h      150-400          K/uL  Neutrophils, %                           80         h      43-77            %  Lymphocytes, %                           10         l      12-46            %  Monocytes, %  8                 3-12             %  Eosinophils, %                           2                 0-5              %  Basophils, %                             0                 0-1              %  Neutrophils, Absolute                    9.1        h      1.7-7.7          K/uL  Lymphocytes, Absolute                    1.1               0.7-4.0          K/uL  Monocytes, Absolute                      0.9               0.1-1.0          K/uL  Eosinophils, Absolute                    0.2               0.0-0.7          K/uL  Basophils, Absolute                      0.1               0.0-0.1          K/uL  CMET Sodium (NA)                              134        l      135-145          mEq/L  Potassium (K)                            2.3        L      3.5-5.1          mEq/L    CRITICAL RESULT CALLED TO, READ BACK BY AND VERIFIED WITH:    RN D. BEASON 04/29/10 1911 KERAN M.    REPEATED TO VERIFY  Chloride                                 96                96-112  mEq/L  CO2                                      24                19-32            mEq/L  Glucose                                  111        h      70-99            mg/dL  BUN                                      31         h      6-23             mg/dL  Creatinine                               3.31       h      0.4-1.2          mg/dL  GFR, Est Non African American            14         l      >60              mL/min  GFR, Est African American                17         l      >60              mL/min    Oversized  comment, see footnote  1  Bilirubin, Total                         1.2               0.3-1.2          mg/dL  Alkaline Phosphatase                     50                39-117           U/L  SGOT (AST)                               27                0-37             U/L  SGPT (ALT)                               13                0-35             U/L  Total  Protein  8.0               6.0-8.3          g/dL  Albumin-Blood                            3.1        l      3.5-5.2          g/dL  Calcium                                  9.6               8.4-10.5         mg/dL  TSH Thyroid Stimulating Hormone (TSH)        1.039             0.350-4.500      uIU/mL  ASSESSMENT AND PLAN: (1) Dysphagia/Loss of appetite Patient presents w/ new onset dysphagia. Differential diagnosis includes carcinoma vs peptic stricture vs achalasia vs esophageal web/ring. Due to the patient's past history of colon cancer and history of smoking, 22 lb weight loss and fever of unknown origin symptoms may likely be caused by cancer.  Peptic stricture is commonly related to acid reflux.  However patient denies experiencing heartburn lately and is currently on omeprazole. Esophageal rings can cause dysphagia however not likely give that this patient has not experienced intermittent dysphagia for solids. Achalasia is a rare cause of dysphagia, symptoms are associated with weight loss which the patient has experienced.  However patients with achalasia usually have symptoms such as chest pain and heartburn which the patient denies.  - CXR to evaluate if mass is present; f/u results - pending CXR will consider C/S GI - Ordered swallow study to evaluate - NPO - CBC - ESR - CRP  (2) Weight Loss Given patient's h/o of weight loss and fever of unknown origin, weight loss could likely be caused by some type of infection.  Unfortunately at this point the origin of this infection is unknown at the moment.  In  the past patient has reported feeling better while on Cipro, and per Dr. Orvan Falconer, this may have suppressed some type of bacterial infection.  Fever and weight loss in a previous smoker are also concerning signs for cancer.  The patient has a history of colon cancer, with her last colonoscopy in 2006. Other less likely causes of weight loss include HIV or hyperthyroidism.  - Ordered CXR & abdominal XR to eval for mets or signs of infection - Ordered amylase, lipase, CRP, ESR  - Blood cx x 2 - UA, Urine microscpy - HIV  - TSH   (3) UC Patient's last colonoscopy was in 2007. Dr. Loreta Ave is GI physician. - continue Lialda  (4) COPD - continue Spiriva, Symbicort, Albuterol  (4) HTN - holding antihypertensive- Hydralazine as needed  (5 ) Glaucoma - continue timolol  (6)VTE PROPH: - heparin [place] 5000 units Q8Hrs.  ATTENDING PHYSICIAN: I discussed the case with the resident(s) as noted ad reviewed the resident's notes. I agree with the finding and plan - please refer to the attending physician note for more details.  Signature__________________________________________  Printed Name_______________________________________

## 2010-07-20 NOTE — Assessment & Plan Note (Signed)
Summary: New pt Persistent fever/received records/kdw   Referring Gergory Biello:  Self Referral Primary [name]:  Dr. Lucianne Muss  CC:  persistent fever, WBCs elevated x 3 weeks, and loss of appetite.  History of Present Illness: Ms. [name] is a 70 year old who is referred to me by Dr. [name] for evaluation of fever.  She says that she began to have fever about 3 weeks ago.  The fevers tend to come every afternoon and range anywhere from 100 to 102.4 degrees.  She says she also noted a little bit of anorexia and weight loss but is unsure how much he has lost.  Her computer record shows that she has lost 10 pounds since March of this year.  She says that she feels a little more tired than usual but otherwise does not have any specific new problems other than the fever and anorexia.  She denies any chills, sweats, shortness of breath, new cough, headache, sore throat, visual changes, problems with her memory or concentration, nausea, vomiting, diarrhea, and any new skin problems.  She broke a tooth about one month ago and had to have the root extracted by her dentist.  This occurred about one week before she started feeling ill but she is not had any problems with pain to indicate any abscess.  She lives with her mother and has had a tough year caring for her she has had a lot of health problems.  She traveled to [place] recently but has not been anywhere else outside of the state in many years.  She is not sexually active and has never had any sexually transmitted diseases.  She has not had any unusual animal exposure.  He has no history of TB exposure that she knows of.  She denies any history of drug use.She was seen by Dr. Lucianne Muss on September 9. He recorded the left lung base crepitus but her chest x-ray was negative.  Her CBC was unremarkable except for a mildly elevated white blood cell count of 11,500.  Her urinalysis showed 10 to 15 white blood cells.  She was put on Cipro which she  took until September 15.  His notes indicate that she says she felt better her while taking Cipro but had recurrent fever on September 15. He saw her onset December 16. Her exam was unremarkable.  A urine culture and blood culture were done but were negative.  She says that she thinks she is better over the past week.  She feels like her appetite is improving and she thinks her fevers have not been going quite as high as they had been.  Preventive Screening-Counseling & Management  Alcohol-Tobacco     Alcohol drinks/day: 0     Smoking Status: quit     Year Quit: 2010  Caffeine-Diet-Exercise     Caffeine use/day: tea     Does Patient Exercise: no  Safety-Violence-Falls     Seat Belt Use: yes   Updated Prior Medication List: SPIRIVA HANDIHALER 18 MCG CAPS (TIOTROPIUM BROMIDE MONOHYDRATE) inhale contents of 1 capsule daily SYMBICORT 160-4.5 MCG/ACT AERO (BUDESONIDE-FORMOTEROL FUMARATE) 2 puffs two times a day PROAIR HFA 108 (90 BASE) MCG/ACT  AERS (ALBUTEROL SULFATE) 1-2 puffs every 4-6 hours as needed VERAPAMIL HCL CR 120 MG CR-TABS (VERAPAMIL HCL) Take 1 tablet by mouth once a day TRIAMTERENE-HCTZ 37.5-25 MG TABS (TRIAMTERENE-HCTZ) 1/2 tab by mouth once daily PRAVASTATIN SODIUM 40 MG TABS (PRAVASTATIN SODIUM) 1 once daily TIMOLOL MALEATE 0.5 % SOLN (TIMOLOL MALEATE) 1 drop in  each eye two times a day LIALDA 1.2 GM TBEC (MESALAMINE) Take 1 tablet by mouth once a day CLONAZEPAM 0.5 MG TABS (CLONAZEPAM) take by mouth as needed  Current Allergies (reviewed today): No known allergies  Past History:  Past Medical History: COLITIS, HX OF (ICD-V12.79) HYPERTENSION (ICD-401.9)   Hyperlipidemia Colon cancer, hx of Colonic polyps, hx of  Vital Signs:  Patient profile:   70 year old female Height:      66 inches (167.64 cm) Weight:      153.75 pounds (69.89 kg) BMI:     24.91 Temp:     99.0 degrees F (37.22 degrees C) oral Pulse rate:   98 / minute BP sitting:   134 / 78  (left  arm) Cuff size:   regular  Vitals Entered By: [name] RN (March 15, 2010 3:34 PM) CC: persistent fever, WBCs elevated x 3 weeks, loss of appetite Is Patient Diabetic? No Pain Assessment Patient in pain? no      Nutritional Status BMI of 19 -24 = normal Nutritional Status Detail no appetite   Have you ever been in a relationship where you felt threatened, hurt or afraid?not asked    Does patient need assistance? Functional Status Self care Ambulation Impaired:Risk for fall Comments unstable gait   Physical Exam  General:  alert and well-nourished.   Head:  palpation of the temporal and facial arteries is normal. Eyes:  pupils equal, pupils round, corneas and lenses clear, and no injection.   Mouth:  pharynx pink and moist, no erythema, no exudates, fair dentition, and teeth missing.  there is no swelling or tenderness to suggest dental infection. Lungs:  normal breath sounds, no crackles, and no wheezes.   Heart:  normal rate, regular rhythm, and no murmur.   Abdomen:  soft, non-tender, normal bowel sounds, no masses, no hepatomegaly, and no splenomegaly.   Msk:  normal ROM, no joint tenderness, no joint swelling, no joint warmth, and no redness over joints.   Skin:  she has some vitiligo on her face and some nondescript brown hyperpigmented patches on her legs which appear chronic. Cervical Nodes:  no anterior cervical adenopathy and no posterior cervical adenopathy.   Axillary Nodes:  no R axillary adenopathy and no L axillary adenopathy.   Psych:  normally interactive, good eye contact, not anxious appearing, and not depressed appearing.     Impression & Recommendations:  Problem # 1:  FEVER UNSPECIFIED (ICD-780.60) She is a nonspecific history a fever and anorexia.  Although she had some pyuria on a clean-catch urinalysis there is nothing else to suggest a symptomatic urinary tract infection.  The fevers did begin shortly after a dental extraction but I do not  find any evidence of dental abscess. I will repeat a UA and CBC along with a complete metabolic panel, sed rate and C-reactive protein today.  I will call her in the next two to 3 days.  I asked her to keep a record of her temperatures and any other symptoms that she has.  If she is getting better then she may not require any further diagnostic testing.  If the fevers persist and no obvious diagnosis is forthcoming I will have her come back in a few days for repeat blood cultures. Orders: T-Comprehensive Metabolic Panel [id number]) T-C-Reactive Protein [id number]) T-CBC w/Diff [id number]) T-Sed Rate (Automated) [id number]) T-Urinalysis ([id number]) Consultation Level IV (42595)  Patient Instructions: 1)  I will call with laboratory results on Thursday, 03/18/10.

## 2010-07-20 NOTE — Miscellaneous (Signed)
Summary: Medications updated  Clinical Lists Changes  Medications: Added new medication of CIPROFLOXACIN HCL 500 MG TABS (CIPROFLOXACIN HCL) as directed by Dr. Lucianne Muss

## 2010-07-20 NOTE — Progress Notes (Signed)
Summary: Update on fevers.  Phone Note Call from Patient   Summary of Call: Pt is calling to give update as requested by Dr Garald Balding on her increased temps.  Fevers ranging from 99-100.2  She has been taking one tylenol tablet every 5 hours from fear of liver failure.  The throat hoarseness is gone but the mucus is present.   No other problems to report at this time. Tomasita Morrow RN  March 31, 2010 3:53 PM   Follow-up for Phone Call        Please suggest that she stop the Tylenol and continue to monitor temps over the next week then call us back. It sounds like her recent illness is resolving. Follow-up by: Cliffton Asters MD,  April 01, 2010 4:14 PM     Appended Document: Update on fevers. Pt informed the above and she is somewhat cautious about not taking the tylenol. She has concerns it may go higher  if it is not taken.  She was advised to not take any tylenol and if the tempts increase , record the temp along with the date and time,  take the tylenol and call our office with a report of how many times that happened.  Tomasita Morrow, RN

## 2010-07-20 NOTE — Assessment & Plan Note (Signed)
Summary: rov for emphysema   Primary Tanuj Mullens/Referring Mozes Sagar:  Dr. Lucianne Muss  CC:  pt states she has had a fever for a month. Pt states her breathing is gets a little bad a ttimes. pt states she feels tired often. Pt states she has been having ry throat and hoarsness. pt is not smoking.  Marland Kitchen  History of Present Illness: the pt comes in today for f/u of her known emphysema.  She has been stable from a pulmonary standpoint, and feels that her breathing is near her usual baseline.  She has not had a recent pulmonary infection or acute exacerbation, but does c/o postnasal drip, sore throat, and hoarseness.  She is currently taking doxycycline from another MD.    Current Medications (verified): 1)  Spiriva Handihaler 18 Mcg Caps (Tiotropium Bromide Monohydrate) .... Inhale Contents of 1 Capsule Daily 2)  Symbicort 160-4.5 Mcg/act Aero (Budesonide-Formoterol Fumarate) .... 2 Puffs Two Times A Day 3)  Proair Hfa 108 (90 Base) Mcg/act  Aers (Albuterol Sulfate) .Marland Kitchen.. 1-2 Puffs Every 4-6 Hours As Needed 4)  Verapamil Hcl Cr 120 Mg Cr-Tabs (Verapamil Hcl) .... Take 1 Tablet By Mouth Once A Day 5)  Triamterene-Hctz 37.5-25 Mg Tabs (Triamterene-Hctz) .... 1/2 Tab By Mouth Once Daily 6)  Pravastatin Sodium 40 Mg Tabs (Pravastatin Sodium) .Marland Kitchen.. 1 Once Daily 7)  Timolol Maleate 0.5 % Soln (Timolol Maleate) .Marland Kitchen.. 1 Drop in Each Eye Two Times A Day 8)  Lialda 1.2 Gm Tbec (Mesalamine) .... Take 1 Tablet By Mouth Once A Day 9)  Clonazepam 0.5 Mg Tabs (Clonazepam) .... Take By Mouth As Needed 10)  Align  Caps (Probiotic Product) 11)  Doxycycline Hyclate 100 Mg Caps (Doxycycline Hyclate) .... As Directed  Allergies (verified): No Known Drug Allergies  Review of Systems       The patient complains of shortness of breath with activity, non-productive cough, loss of appetite, and fever.  The patient denies shortness of breath at rest, productive cough, coughing up blood, chest pain, irregular heartbeats, acid  heartburn, indigestion, weight change, abdominal pain, difficulty swallowing, tooth/dental problems, headaches, nasal congestion/difficulty breathing through nose, sneezing, itching, ear ache, anxiety, depression, hand/feet swelling, joint stiffness or pain, rash, and change in color of mucus.    Vital Signs:  Patient profile:   70 year old female Height:      66 inches Weight:      153.13 pounds BMI:     24.81 O2 Sat:      95 % on Room air Temp:     98.3 degrees F oral Pulse rate:   93 / minute BP sitting:   110 / 60  (left arm) Cuff size:   regular  Vitals Entered By: Carver Fila (March 26, 2010 12:02 PM)  O2 Flow:  Room air CC: pt states she has had a fever for a month. Pt states her breathing is gets a little bad a ttimes. pt states she feels tired often. Pt states she has been having ry throat, hoarsness. pt is not smoking.   Comments meds and allergies updated Phone number updated Carver Fila  March 26, 2010 12:02 PM    Physical Exam  General:  wd female in nad Nose:  no purulence or discharge noted. Mouth:  one tiny spot of thrush, o/w clear with no lesions noted. Lungs:  decreased bs but no wheezing or rhonchi Heart:  distant, but sounds regular.  No mrg Extremities:  no edema or cyanosis  Neurologic:  alert and  oriented, moves all 4 extrem.   Impression & Recommendations:  Problem # 1:  EMPHYSEMA (ICD-492.8)  the pt has known severe airflow obstruction, and feels that her breathing has been adequately controlled on her bronchodilator regimen.  Her exertional tolerance is at baseline, and she has had no acute exacerbation or pulmonary infection since the last visit.  She is having mucus in her throat and hoarseness, and it is unclear if this is from her symbicort or not.  I am suspicious of postnasal drip and LPR given her "definite mucus in throat".  Will try her on otc antihistamine and PPI, and if continues, will change symbicort to foradil as a  trial.  Medications Added to Medication List This Visit: 1)  Doxycycline Hyclate 100 Mg Caps (Doxycycline hyclate) .... As directed  Other Orders: Est. Patient Level III (16109)  Patient Instructions: 1)  stay on spiriva and symbicort for now. 2)  can try chlorpheniramine 8mg  over the counter at bedtime for next 2 weeks to see if helps your postnasal drip. 3)  take prilosec otc one each am for next 2 weeks 4)  Please call and give me feedback in 2 weeks to let me know if mucus in throat and hoarseness is better.  If continues, would change your symbicort to something else. 5)  If doing well, will see again in 6mos.   Immunization History:  Influenza Immunization History:    Influenza:  historical (03/20/2009)

## 2010-07-20 NOTE — Progress Notes (Signed)
  Phone Note Other Incoming   Details for Reason: labs Summary of Call: I reviewed her labs. She has mild eosinophilia and pyuria. She has persistent fever up to 101.6 but no other sxs and her appetite is improving. I will see her back in 2 weeks. Initial call taken by: Cliffton Asters MD,  March 18, 2010 1:58 PM  Follow-up for Phone Call       Follow-up by: Cliffton Asters MD,  March 18, 2010 1:56 PM  New Problems: COLITIS (ICD-558.9)   New Problems: COLITIS (ICD-558.9) New/Updated Medications: ALIGN  CAPS (PROBIOTIC PRODUCT)

## 2010-07-20 NOTE — Miscellaneous (Signed)
Summary: spiriva from Patient assistance program  Clinical Lists Changes received 90 day supply of Spiriva from Patient Assistance program.  spoke with pt.  pt aware she can pick up meds at the front desk. Aundra Millet Reynolds LPN  September 24, 6043 10:41 AM

## 2010-07-20 NOTE — Miscellaneous (Signed)
Summary: [facility]  [facility]   Imported By: Sherian Rein 12/25/2009 09:44:35  _____________________________________________________________________  External Attachment:    Type:   Image     Comment:   External Document

## 2010-07-20 NOTE — Consult Note (Signed)
Summary: New Pt. Referral: Dr. [name]  New Pt. Referral: Dr. [name]   Imported By: Florinda Marker 03/23/2010 08:43:32  _____________________________________________________________________  External Attachment:    Type:   Image     Comment:   External Document

## 2010-07-20 NOTE — Assessment & Plan Note (Signed)
Summary: rov for emphysema   Copy to:  Self Referral Primary Provider/Referring Provider:  Dr. Lucianne Muss  CC:  4 month followup.  Pt states that she feels that her breathing has improved since last seen.  She states that it has least improved by 30-40 %.  She c/o hoarsness that she relates to inhalers.  She states that she rinses mouth after symbicort.  She also c/o food sometimes getting stuck in her throat x several months..  History of Present Illness: the pt comes in today for f/u of her known emphysema.  She has maintained on her bronchodilator regimen, has stopped smoking, and is starting in pulm rehab.  She has seen significant improvement in her doe, cough, congestion.  Her only complaint is dysphonia at times associated with her inhalers.  She is rinsing compliantly.  Current Medications (verified): 1)  Proair Hfa 108 (90 Base) Mcg/act  Aers (Albuterol Sulfate) .Marland Kitchen.. 1-2 Puffs Every 4-6 Hours As Needed 2)  Verapamil Hcl Cr 120 Mg Cr-Tabs (Verapamil Hcl) .... Take 1 Tablet By Mouth Once A Day 3)  Triamterene-Hctz 37.5-25 Mg Tabs (Triamterene-Hctz) .... 1/2 Tab By Mouth Once Daily 4)  Timolol Maleate 0.5 % Soln (Timolol Maleate) .Marland Kitchen.. 1 Drop in Each Eye Two Times A Day 5)  Lialda 1.2 Gm Tbec (Mesalamine) .... Take 1 Tablet By Mouth Once A Day 6)  Clonazepam 0.5 Mg Tabs (Clonazepam) .... Take By Mouth As Needed 7)  Pravastatin Sodium 40 Mg Tabs (Pravastatin Sodium) .Marland Kitchen.. 1 Once Daily 8)  Spiriva Handihaler 18 Mcg Caps (Tiotropium Bromide Monohydrate) .... Inhale Contents of 1 Capsule Daily 9)  Symbicort 160-4.5 Mcg/act Aero (Budesonide-Formoterol Fumarate) .... 2 Puffs Two Times A Day  Allergies (verified): No Known Drug Allergies  Review of Systems      See HPI  Vital Signs:  Patient profile:   70 year old female Weight:      163 pounds O2 Sat:      94 % on Room air Temp:     98.0 degrees F oral Pulse rate:   102 / minute BP sitting:   112 / 70  (left arm)  Vitals Entered By:  Vernie Murders (September 17, 2009 2:32 PM)  O2 Flow:  Room air  Physical Exam  General:  wd female in nad Lungs:  decreased bs, but no wheezing  or rhonchi Heart:  rrr Extremities:  no edema or cyanosis Neurologic:  alert and oriented, moves all 4.   Impression & Recommendations:  Problem # 1:  EMPHYSEMA (ICD-492.8) the pt is much improved with smoking cessation and her bronchodilator regimen.  She has also started in pulmonary rehab.  I have asked her to continue with current meds, and to f/u with me in 6mos.  She will work on better rinsing, gargling, and I have asked her to swallow rather that spitting after rinsing to see if this will help with dysphonia.  Medications Added to Medication List This Visit: 1)  Pravastatin Sodium 40 Mg Tabs (Pravastatin sodium) .Marland Kitchen.. 1 once daily 2)  Spiriva Handihaler 18 Mcg Caps (Tiotropium bromide monohydrate) .... Inhale contents of 1 capsule daily 3)  Symbicort 160-4.5 Mcg/act Aero (Budesonide-formoterol fumarate) .... 2 puffs two times a day  Other Orders: Est. Patient Level II (16109) Misc. Referral (Misc. Ref)  Patient Instructions: 1)  stay on symbicort and spiriva 2)  after using symbicort, rinse mouth, gargle, and then SWALLOW. 3)  stay off cigarettes. 4)  stay in pulmonary rehab. 5)  followup with  me in 6mos

## 2010-07-21 ENCOUNTER — Ambulatory Visit: Payer: Self-pay | Admitting: Radiation Oncology

## 2010-07-21 ENCOUNTER — Encounter: Payer: Self-pay | Admitting: Cardiothoracic Surgery

## 2010-07-21 LAB — CBC
HCT: 27.5 % — ABNORMAL LOW (ref 36.0–46.0)
Hemoglobin: 9 g/dL — ABNORMAL LOW (ref 12.0–15.0)
MCH: 29.8 pg (ref 26.0–34.0)
MCHC: 32.4 g/dL (ref 30.0–36.0)
RDW: 16.5 % — ABNORMAL HIGH (ref 11.5–15.5)

## 2010-07-21 LAB — BASIC METABOLIC PANEL
CO2: 17 mEq/L — ABNORMAL LOW (ref 19–32)
Calcium: 8 mg/dL — ABNORMAL LOW (ref 8.4–10.5)
Creatinine, Ser: 1.89 mg/dL — ABNORMAL HIGH (ref 0.4–1.2)
GFR calc non Af Amer: 26 mL/min — ABNORMAL LOW (ref >60)
Glucose, Bld: 76 mg/dL (ref 70–99)

## 2010-07-22 ENCOUNTER — Ambulatory Visit: Payer: Self-pay | Admitting: Radiation Oncology

## 2010-07-22 LAB — LACTIC ACID, PLASMA: Lactic Acid, Venous: 2.3 mmol/L — ABNORMAL HIGH (ref 0.5–2.2)

## 2010-07-22 LAB — CBC
MCH: 30.4 pg (ref 26.0–34.0)
MCHC: 32.7 g/dL (ref 30.0–36.0)
MCV: 93 fL (ref 78.0–100.0)
Platelets: 479 10*3/uL — ABNORMAL HIGH (ref 150–400)
RDW: 16.4 % — ABNORMAL HIGH (ref 11.5–15.5)

## 2010-07-22 LAB — BASIC METABOLIC PANEL
BUN: 15 mg/dL (ref 6–23)
Calcium: 8.1 mg/dL — ABNORMAL LOW (ref 8.4–10.5)
Creatinine, Ser: 1.74 mg/dL — ABNORMAL HIGH (ref 0.4–1.2)
GFR calc non Af Amer: 29 mL/min — ABNORMAL LOW (ref >60)

## 2010-07-22 NOTE — Consult Note (Signed)
Summary: [facility]  [facility]   Imported By: Florinda Marker 06/08/2010 10:00:56  _____________________________________________________________________  External Attachment:    Type:   Image     Comment:   External Document

## 2010-07-22 NOTE — Progress Notes (Signed)
Summary: refills  Phone Note Call from Patient Call back at Home Phone [phone]   Caller: Patient Call For: clance Reason for Call: Refill Medication Summary of Call: Pt requests refills on symbicort 160-4.73mcg and spiriva handihaler 31mcg.[facility]) Initial call taken by: Darletta Moll,  July 07, 2010 12:42 PM  Follow-up for Phone Call        Spoke with pt.  She is wanting to know if her shipment of spiriva has arrived.  She also asked about symbicort, I believe this normally comes to the pt's home and I will inform her of this when I call her back. Pls advise thanks Vernie Murders  July 07, 2010 2:37 PM  we have not received her shipmet yet for Spiriva.  and Yes, the Symbicort should be getting mailed directly to her home address straight from the company.  Aundra Millet Reynolds LPN  July 07, 2010 4:15 PM    Additional Follow-up for Phone Call Additional follow up Details #1::        Spoke with pt and advised of the above.  She verbalized understanding.  I left her 1 box of each since she is running out.  Additional Follow-up by: Vernie Murders,  July 07, 2010 4:27 PM

## 2010-07-23 ENCOUNTER — Inpatient Hospital Stay (HOSPITAL_COMMUNITY): Payer: Medicaid Other

## 2010-07-23 ENCOUNTER — Encounter (INDEPENDENT_AMBULATORY_CARE_PROVIDER_SITE_OTHER): Payer: Self-pay | Admitting: *Deleted

## 2010-07-23 DIAGNOSIS — N179 Acute kidney failure, unspecified: Secondary | ICD-10-CM

## 2010-07-23 LAB — CARDIAC PANEL(CRET KIN+CKTOT+MB+TROPI)
CK, MB: 3.8 ng/mL (ref 0.3–4.0)
CK, MB: 3.3 ng/mL (ref 0.3–4.0)
Relative Index: INVALID (ref 0.0–2.5)
Total CK: 51 U/L (ref 7–177)
Total CK: 37 U/L (ref 7–177)
Total CK: 36 U/L (ref 7–177)
Troponin I: 0.1 ng/mL — ABNORMAL HIGH (ref 0.00–0.06)

## 2010-07-23 LAB — CBC
Hemoglobin: 8.3 g/dL — ABNORMAL LOW (ref 12.0–15.0)
MCH: 31 pg (ref 26.0–34.0)
MCV: 92.5 fL (ref 78.0–100.0)
RBC: 2.68 MIL/uL — ABNORMAL LOW (ref 3.87–5.11)

## 2010-07-23 LAB — URINALYSIS, ROUTINE W REFLEX MICROSCOPIC
Bilirubin Urine: NEGATIVE
Ketones, ur: NEGATIVE mg/dL
Nitrite: NEGATIVE
pH: 7 (ref 5.0–8.0)

## 2010-07-23 LAB — DIFFERENTIAL
Eosinophils Absolute: 0.3 10*3/uL (ref 0.0–0.7)
Lymphs Abs: 0.4 10*3/uL — ABNORMAL LOW (ref 0.7–4.0)
Monocytes Relative: 6 % (ref 3–12)
Neutro Abs: 7.9 10*3/uL — ABNORMAL HIGH (ref 1.7–7.7)
Neutrophils Relative %: 86 % — ABNORMAL HIGH (ref 43–77)

## 2010-07-23 LAB — BASIC METABOLIC PANEL
BUN: 14 mg/dL (ref 6–23)
Calcium: 7.9 mg/dL — ABNORMAL LOW (ref 8.4–10.5)
Creatinine, Ser: 1.8 mg/dL — ABNORMAL HIGH (ref 0.4–1.2)
GFR calc non Af Amer: 28 mL/min — ABNORMAL LOW (ref >60)
Glucose, Bld: 89 mg/dL (ref 70–99)
Potassium: 3.6 mEq/L (ref 3.5–5.1)

## 2010-07-23 LAB — MRSA PCR SCREENING: MRSA by PCR: NEGATIVE

## 2010-07-24 ENCOUNTER — Inpatient Hospital Stay (HOSPITAL_COMMUNITY): Payer: Medicaid Other

## 2010-07-24 ENCOUNTER — Encounter (HOSPITAL_COMMUNITY): Payer: Self-pay | Admitting: Internal Medicine

## 2010-07-24 LAB — CBC
Hemoglobin: 8.4 g/dL — ABNORMAL LOW (ref 12.0–15.0)
MCH: 30.7 pg (ref 26.0–34.0)
MCHC: 33.6 g/dL (ref 30.0–36.0)
Platelets: 419 10*3/uL — ABNORMAL HIGH (ref 150–400)
RDW: 16.4 % — ABNORMAL HIGH (ref 11.5–15.5)

## 2010-07-24 LAB — VITAMIN B12: Vitamin B-12: 421 pg/mL (ref 211–911)

## 2010-07-24 LAB — BASIC METABOLIC PANEL
BUN: 10 mg/dL (ref 6–23)
Calcium: 7.9 mg/dL — ABNORMAL LOW (ref 8.4–10.5)
Chloride: 108 mEq/L (ref 96–112)
Creatinine, Ser: 1.79 mg/dL — ABNORMAL HIGH (ref 0.4–1.2)
GFR calc Af Amer: 34 mL/min — ABNORMAL LOW (ref >60)
GFR calc non Af Amer: 28 mL/min — ABNORMAL LOW (ref >60)

## 2010-07-24 LAB — BRAIN NATRIURETIC PEPTIDE: Pro B Natriuretic peptide (BNP): 716 pg/mL — ABNORMAL HIGH (ref 0.0–100.0)

## 2010-07-24 LAB — D-DIMER, QUANTITATIVE: D-Dimer, Quant: 1.74 ug/mL-FEU — ABNORMAL HIGH (ref 0.00–0.48)

## 2010-07-24 MED ORDER — TECHNETIUM TO 99M ALBUMIN AGGREGATED: 5.5000 | Freq: Once | INTRAVENOUS | Status: AC | PRN

## 2010-07-24 MED ORDER — XENON XE 133 GAS: 7.2000 | GAS_FOR_INHALATION | Freq: Once | RESPIRATORY_TRACT | Status: AC | PRN

## 2010-07-25 LAB — BASIC METABOLIC PANEL
BUN: 11 mg/dL (ref 6–23)
Calcium: 7.8 mg/dL — ABNORMAL LOW (ref 8.4–10.5)
Chloride: 106 mEq/L (ref 96–112)
Creatinine, Ser: 1.86 mg/dL — ABNORMAL HIGH (ref 0.4–1.2)
GFR calc Af Amer: 33 mL/min — ABNORMAL LOW (ref >60)

## 2010-07-25 LAB — CBC
MCH: 30.4 pg (ref 26.0–34.0)
MCHC: 33.3 g/dL (ref 30.0–36.0)
MCV: 91.1 fL (ref 78.0–100.0)
Platelets: 453 10*3/uL — ABNORMAL HIGH (ref 150–400)
RBC: 2.47 MIL/uL — ABNORMAL LOW (ref 3.87–5.11)

## 2010-07-25 LAB — FERRITIN: Ferritin: 589 ng/mL — ABNORMAL HIGH (ref 10–291)

## 2010-07-25 LAB — CLOSTRIDIUM DIFFICILE BY PCR: Toxigenic C. Difficile by PCR: NEGATIVE

## 2010-07-25 LAB — IRON: Iron: 30 ug/dL — ABNORMAL LOW (ref 42–135)

## 2010-07-26 ENCOUNTER — Inpatient Hospital Stay (HOSPITAL_COMMUNITY): Payer: Medicaid Other

## 2010-07-26 LAB — ABO/RH: ABO/RH(D): O POS

## 2010-07-26 LAB — BASIC METABOLIC PANEL
BUN: 9 mg/dL (ref 6–23)
Calcium: 7.9 mg/dL — ABNORMAL LOW (ref 8.4–10.5)
GFR calc non Af Amer: 29 mL/min — ABNORMAL LOW (ref >60)
Glucose, Bld: 88 mg/dL (ref 70–99)
Sodium: 138 mEq/L (ref 135–145)

## 2010-07-26 LAB — CBC
HCT: 21.3 % — ABNORMAL LOW (ref 36.0–46.0)
MCHC: 33.8 g/dL (ref 30.0–36.0)
MCV: 90.6 fL (ref 78.0–100.0)
Platelets: 418 10*3/uL — ABNORMAL HIGH (ref 150–400)
RDW: 16.3 % — ABNORMAL HIGH (ref 11.5–15.5)

## 2010-07-27 LAB — URINE CULTURE
Colony Count: 2000
Culture  Setup Time: 201202041706

## 2010-07-27 LAB — BASIC METABOLIC PANEL
GFR calc Af Amer: 39 mL/min — ABNORMAL LOW (ref >60)
GFR calc non Af Amer: 32 mL/min — ABNORMAL LOW (ref >60)
Potassium: 4.3 mEq/L (ref 3.5–5.1)
Sodium: 135 mEq/L (ref 135–145)

## 2010-07-27 LAB — CBC
Platelets: 355 10*3/uL (ref 150–400)
RDW: 15.9 % — ABNORMAL HIGH (ref 11.5–15.5)
WBC: 5.1 10*3/uL (ref 4.0–10.5)

## 2010-07-27 LAB — ACTH STIMULATION, 3 TIME POINTS
Cortisol, 30 Min: 14.7 ug/dL (ref >20)
Cortisol, 60 Min: 16.8 ug/dL (ref >20)
Cortisol, Base: 4.9 ug/dL

## 2010-07-27 LAB — CROSSMATCH: ABO/RH(D): O POS

## 2010-07-28 LAB — BASIC METABOLIC PANEL
BUN: 13 mg/dL (ref 6–23)
GFR calc non Af Amer: 33 mL/min — ABNORMAL LOW (ref >60)
Potassium: 4.1 mEq/L (ref 3.5–5.1)
Sodium: 136 mEq/L (ref 135–145)

## 2010-07-28 LAB — CBC
MCV: 90.6 fL (ref 78.0–100.0)
Platelets: 403 10*3/uL — ABNORMAL HIGH (ref 150–400)
RDW: 15.9 % — ABNORMAL HIGH (ref 11.5–15.5)
WBC: 7.4 10*3/uL (ref 4.0–10.5)

## 2010-07-28 NOTE — Miscellaneous (Addendum)
Summary: refill  Clinical Lists Changes  Medications: Changed medication from PRILOSEC OTC 20 MG TBEC (OMEPRAZOLE MAGNESIUM) Take 1 tablet by mouth once a day to PANTOPRAZOLE SODIUM 40 MG TBEC (PANTOPRAZOLE SODIUM) Take 1 tablet by mouth two times a day - Signed Rx of PANTOPRAZOLE SODIUM 40 MG TBEC (PANTOPRAZOLE SODIUM) Take 1 tablet by mouth two times a day;  #30 x 3;  Signed;  Entered by: [name] RN;  Authorized by: Cliffton Asters MD;  Method used: [facility] [phone]*, [address], [place], [place], [place]  [id number], Ph: [phone] or [phone], Fax: [phone]    Prescriptions: PANTOPRAZOLE SODIUM 40 MG TBEC (PANTOPRAZOLE SODIUM) Take 1 tablet by mouth two times a day  #30 x 3   Entered by:   [name] RN   Authorized by:   Cliffton Asters MD   Signed by:   [name] RN on 07/23/2010   Method used:   [facility]  [address] [phone] (retail)       [address]       [place], [place]  [id number]       Ph: [phone] or [phone]       Fax: [phone]   RxID:   [id number]

## 2010-07-29 LAB — CULTURE, BLOOD (ROUTINE X 2)
Culture  Setup Time: 201202031825
Culture: NO GROWTH

## 2010-07-29 LAB — CBC
HCT: 29.9 % — ABNORMAL LOW (ref 36.0–46.0)
Platelets: 349 10*3/uL (ref 150–400)
RDW: 16 % — ABNORMAL HIGH (ref 11.5–15.5)
WBC: 9.1 10*3/uL (ref 4.0–10.5)

## 2010-08-03 NOTE — Progress Notes (Signed)
[name], [name]             ACCOUNT NO.:  [id number]  MEDICAL RECORD NO.:  [id number]           PATIENT TYPE:  I  LOCATION:  1506                         FACILITY:  [facility]  PHYSICIAN:  Erick Blinks, MD     DATE OF BIRTH:  August 20, 1940                                PROGRESS NOTE   PRIMARY CARE PHYSICIAN: [name], MD  CURRENT DIAGNOSES: 1. 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 2. Confusion, etiology unclear. 3. Possible urinary tract infection. 4. Chronic kidney disease stage 3. 5. Iron-deficiency anemia status post 1 unit of packed red blood     cells. 6. Anemia is likely secondary to renal disease. 7. Non-anion gap metabolic acidosis, resolved. 8. History of ulcerative colitis, on mesalamine. 9. 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 10.History of hypertension. 11.Acute renal failure, improved. 12.Dehydration, improved. 13.Elevated troponin likely secondary to demand ischemia. 14.Diarrhea, improved, Clostridium difficile negative. 15.Hypokalemia.  DISCHARGE MEDICATIONS: These will be reconciled by the discharging physician.  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  The patient has been receiving radiation therapy and well up until a few days ago when she started having abdominal discomfort.  The patient had been getting progressively weaker.  She actually reports that she was feeling constipated and had mid lower abdominal discomfort.  In the ER, she was found to be in acute renal failure, subsequently admitted for further evaluation.  For further details, please refer to the history and physical dictated by Dr. Betti Cruz on July 20, 2010.  HOSPITAL COURSE: 1. Diarrhea  and dehydration.  These both corrected with IV fluids.     Her diarrhea was self-limited.  Currently, she is not having any     further episodes.  Her C difficile was found to be negative.  She     was adequately hydrated with IV fluids and is no longer dehydrated. 2. Hypotension and tachycardia.  The patient is noted be significantly     hypotensive into the 80s requiring multiple fluid boluses.  She was     also tachycardic in the 130s.  Serum cortisol was checked and was     found to be a low 3.  An ACTH stimulation test was also checked     with suboptimal response with a serum cortisol less than 16 after     ACTH administration.  The patient could have component of adrenal     insufficiency.  She was started on IV dexamethasone, which has not     been switched over to oral prednisone.  She can follow up with her     primary care physician for further management.  Since then her     blood pressure has improved and she is no longer hypotensive and     her heart rate is more stable. 3. Confusion.  The patient has been noted to somewhat confused     throughout her hospital course.  According to her primary care     physician as well as her family, she is a very sharp person who is     very functional.  She pays her own bills.  She drives and she in     fact takes care of her 15 year old mother.  During the hospital     here, she is alert and oriented, but is often confused.  She had     difficulty doing simple maths and often becomes disoriented to her     place and time.  Workup thus far has been unrevealing.  She does     have 7 to 10 WBCs in her urine, but her urine culture did not     really have any significant growth.  I am not convinced that this     is the main cause, but due to the persistence of her symptoms, we     will treat her with a course of antibiotics.  TSH, B12, ammonia,     RPR, CT head as well as MRI of the brain were found to be negative.     Neurology consultation  was kindly provided by Dr. Anne Hahn.  It was     the neurology service's recommendation that her confusion may be     related to urinary tract infection versus side effects of Cipro     although her symptoms did present prior to being on ciprofloxacin.     In any case, we will did discontinue the Cipro in place Ceftin.  It     is unclear as to the exact etiology.  Neurology will be following     on the case. 4. Deconditioning.  The patient appears to be somewhat deconditioned.     She has difficulty ambulating and needs assistance.  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  She reports that she is feeling better and is     somewhat adamant about not going to a skilled nursing facility for     rehabilitation.  We will ask physical therapy to reassess her     status since they have not seen her in sometime. 5. Anemia of chronic disease.  The patient likely has anemia of     chronic disease secondary to her renal disease.  She has been     transfused a total of 1 unit of PRBC while here in the hospital and     has responded appropriately. 6. Thymic cancer.  The patient has been going for a scheduled     radiation treatments and has 2 more sessions left. 7. Acute on chronic kidney disease.  The patient had elevated     creatinine on admission, it was felt to be secondary to     dehydration.  She was given adequate IV fluids and her creatinine     has currently returned to baseline.  CONSULTATION: Dr. Anne Hahn from Neurology.  DIAGNOSTIC IMAGING: 1. Acute abdominal series on June 22, 2010, shows normal bowel gas     pattern, cholecystectomy, postoperative changes of median     sternotomy. 2. Abdominal x-ray on July 23, 2010, shows nonobstructive bowel gas     pattern. 3. CT of head without contrast on July 23, 2010, shows mild     atrophy, no acute intracranial findings. 4. CT abdomen and pelvis July 23, 2010,  shows evidence of anasarca  with small pleural effusion and generalized soft tissue edema,     possible mild rectal wall thickening.  This is not definite and     maybe secondary to incomplete distention correlate clinically.  No     evidence of bowel obstruction or abscess, stable appearance of the     solid parenchymal organs, stable ventral hernia containing fat. 5. A V/Q scan on July 24, 2010, shows examination is considered low     probability for acute pulmonary embolus. 6. Chest x-ray from the July 24, 2010, shows COPD and     hyperinflation without acute findings. 7. MRI of the brain on July 26, 2010, shows no acute infarct,     minimal nonspecific white matter type changes may be result of     small vessel disease.  Global atrophy without hydrocephalus.     Cervical spondylotic changes C3 through C4, mild spinal stenosis     and completely evaluated secondary to motion. 8. MRA of head on July 26, 2010, shows exam is most integrated and     all that can be stated with certainties that there is a fold within     the internal carotid arteries bilaterally, vertebral arteries     bilaterally, and a basilar artery. 9. A 2-D echocardiogram July 23, 2010, shows showed preserved     ejection fraction with an EF of 65% to 70%.  No regional wall     motion abnormalities and grade 1 diastolic dysfunction.  PHYSICAL EXAMINATION: GENERAL:  The patient is alert, oriented, cooperative, although slightly confused.  She denies any complaints at this time. VITAL SIGNS:  Her temperature is 98.  Blood pressure of 104/69, heart rate 98, respirations 18, pulse ox 99% on room air.  GENERAL:  The patient is in no acute distress.  She is lying comfortably in bed. HEENT:  Normocephalic, atraumatic. NECK:  Supple. CHEST:  Clear to auscultation bilaterally. CARDIAC:  S1 and S2 with a regular rate and rhythm. ABDOMEN:  Soft, nontender.  Bowel sounds are active. EXTREMITIES:  No edema,  cyanosis, or clubbing.  PROCEDURES: None.  This brings [place] up-to-date in the hospital course.     Erick Blinks, MD     JM/MEDQ  D:  07/27/2010  T:  07/27/2010  Job:  [id number]  Electronically Signed by Durward Mallard Sofie Schendel  on 08/03/2010 11:20:00 PM

## 2010-08-06 NOTE — Discharge Summary (Signed)
  NAMETONEISHA, [name]             ACCOUNT NO.:  [id number]  MEDICAL RECORD NO.:  [id number]           PATIENT TYPE:  I  LOCATION:  1506                         FACILITY:  [facility]  PHYSICIAN:  Pleas Koch, MD        DATE OF BIRTH:  [birth year]  DATE OF ADMISSION:  07/20/2010 DATE OF DISCHARGE:                              DISCHARGE SUMMARY   ADDENDUM:  DISCHARGE MEDICATIONS:  Discharge medications are as follows: 1. Symbicort 160/4.5 mcg 2 puffs b.i.d. 2. Lialda 1.2 g p.o. 3. Spiriva 18 mcg 1 capsule daily. 4. Megestrol oral suspension 40 mg daily. 5. Clonazepam 0.5 mg 1 tablet q.6 p.r.n. 6. Timolol ophthalmic 0.5% 1 drop b.i.d. 7. Verapamil ER 120 mg p.o. daily. 8. Pravachol 40 mg daily. 9. Align 4 mg p.o. 1 capsule daily. 10.Protonix 40 mg b.i.d. 11.ProAir 1 to 2 puffs q.4 p.r.n.  Medications added 1. Metoprolol 12.5 mg 1 tablet b.i.d. 2. Tetracycline 500 mg, duration 2 days.  The patient to take this     b.i.d. and complete entire course of antibiotics.  Medications to stop are triamterene/hydrochlorothiazide and Klor-Con 20 mEq.  DISCHARGE VITAL SIGNS:  The patient's vitals on discharge, temperature 97.7, pulse rate 95%, respirations 28, blood pressure 103 to 107 over 58 to 74, 100% on room air.  DISCHARGE LABORATORY DATA:  Hemoglobin 10.0, hematocrit 29.9, white count 9.1, platelet count 349.  The patient is discharged home in stable condition.  The patient also discharged on 5 days of prednisone 5 mg and will need to discontinue this after 5 days.  The patient would benefit from a CMP and a CBC on followup in view of the anemia.          ______________________________ Pleas Koch, MD     JS/MEDQ  D:  07/29/2010  T:  07/29/2010  Job:  [id number]  Electronically Signed by Pleas Koch MD on 08/06/2010 03:04:53 PM

## 2010-08-06 NOTE — Discharge Summary (Signed)
  [name], [name] NO.:  [id number]  MEDICAL RECORD NO.:  [id number]           PATIENT TYPE:  LOCATION:                                 FACILITY:  PHYSICIAN:  Pleas Koch, MD        DATE OF BIRTH:  [birth year]  DATE OF ADMISSION: DATE OF DISCHARGE:                              DISCHARGE SUMMARY   ADDENDUM:  The patient was reviewed from the 8th to the 9th of February and was stable during that course of time.  It was noted that she had 2 more episodes of radiation therapy and was then subsequently discharged. Her hypotension actually resolved and she will be discharged home on prednisone 5 mg a day for another 5 days.  It is unclear what the etiology of her low cortisol was, but could likely be secondary to either acute suppression of the adrenal glands versus an effect of radiation therapy.  Her confusion totally cleared and this may also have likely been secondary to dexamethasone.  It was noted that her urine grew coagulase-negative staph, which is sensitive to levofloxacin, nitrofurantoin, rifampin, vancomycin, tetracycline, and gentamicin.  The patient has been on Ceftin b.i.d. secondary to her having some confusion on ciprofloxacin.  Hence, I am hesitant to prescribe levofloxacin for her.  I will discharge her home on tetracycline 500 mg b.i.d. for another 4 days.  This will complete the course of antibiotics for the completion of 3 days of this medication.          ______________________________ Pleas Koch, MD     JS/MEDQ  D:  07/29/2010  T:  07/29/2010  Job:  [id number]  cc:   [name], M.D. Fax: [phone]  Electronically Signed by Pleas Koch MD on 08/06/2010 03:04:56 PM

## 2010-08-09 NOTE — Consult Note (Signed)
[name], [name]             ACCOUNT NO.:  [id number]  MEDICAL RECORD NO.:  [id number]           PATIENT TYPE:  I  LOCATION:  1506                         [facility]ACILITY[facility]  [facility]  PHYSICIAN:  Marlan Palau, M.D.  DATE OF BIRTH:  [birth year]  DATE OF CONSULTATION:  07/27/2010 DATE OF DISCHARGE:                                CONSULTATION   REASON FOR CONSULTATION:  Confusion.  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  The patient has been receiving radiation therapy and doing well on radiation up until a few days ago when she started having abdominal discomfort.  The patient was admitted on July 20, 2010, for abdominal discomfort and weakness. After admission, it was noted that the patient had some slight confusion in addition to UTI.  The patient was at that time started on ciprofloxacin on July 24, 2010, for UTI.  In addition, she had a full altered mental status workup which included TSH, B12, RPR, and folate which were all negative.  Due to continued confusion on July 26, 2010, neurology was consulted for further evaluation.  At that time, a MRI was pending.  MRI has now been obtained which showed no acute stroke, age related atrophy and minor white matter abnormalities.  The patient's MRI was limited secondary to movement.  At the present time, the patient is alert.  She is oriented.  She states that she is feeling much improved.  She cannot give me a very detailed reason of why she is in the hospital but does know that she had some abdominal discomfort.  She questions me what she can do to cure her urinary tract infection.  However, after told that she does have urinary tract and that she is  being treated with antibiotics, she seems to recall this without any difficulty.  She gives a good history that she lives with her mother who is 12 years old.  She states that she continues to drive, and she pays her bills by herself and takes care for herself and all her ADLs.  PAST MEDICAL HISTORY: 1. COPD. 2. Hypertension. 3. 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 4. Chronic kidney disease stage III. 5. Hypokalemia. 6. Hyperlipidemia. 7. History of ulcerative colitis. 8. History of thyroid nodule. 9. Anemia of chronic disease. 10.History of colon cancer, status post hemicolectomy in January of     2012. 11.History of hypomagnesemia. 12.History of distal esophageal stricture, status post dilatation in     May 06, 2011.  CURRENT MEDICATIONS: 1. Symbicort. 2. Cipro 400 mg IV q.24h. 3. Decadron 4 mg IV b.i.d. 4. Lovenox. 5. Ativan 1 mg IV x1. 6. Megace. 7. Mesalamine. 8. Lopressor. 9. Protonix. 10.K-Dur. 11.Crestor. 12.Ophthalmic timolol. 13.Spiriva. 14.Verapamil. 15.Ventolin. 16.Dulcolax. 17.Klonopin p.r.n.  ALLERGIES:  No known drug allergies.  FAMILY HISTORY:  Diabetes, arthritis, and coronary artery disease. Father died of colon cancer.  Mother is alive at 41.  SOCIAL  HISTORY:  The patient does drink socially.  Does not smoke.  She does not do illicit drugs.  She lives with her mother who is in her 77s.  REVIEW OF SYSTEMS:  Positive for abdominal discomfort, urinary tract infection, urinary urge, and confusion.  PHYSICAL EXAMINATION:  VITAL SIGNS:  Blood pressure is 117/70, pulse 96, and respirations 20. GENERAL:  The patient is alert.  She is oriented to time, place, and date.  She carries a 2 and to 3-step commands without any difficulty. LUNGS:  Clear to auscultation bilaterally. CARDIOVASCULAR:  S1 is audible.  S2 is audible. NECK:  Negative bruits and supple. NEUROLOGIC:  The patient's sensation is intact to pinprick,  light touch, and vibration.  However, I do note a distal peripheral neuropathy distribution, especially at her toes and ankles.  The patient's proprioception is intact.  Pupils are equal, round, and reactive to light and accommodating.  Conjugate gaze.  Extraocular movements are intact.  Visual fields grossly intact.  Face symmetric.  Tongue is midline.  Uvula is midline.  Facial sensation V1-V3 is full to pinprick and light touch.  Shoulder shrug and head turn are within normal limits. Coordination, finger-to-nose and heel-to-shin were smooth.  The patient's gait was slow, short wide-based gait but able to walk without assistance.  The patient's motor is 4/5 throughout with no asterixis or clonus.  No increased tone.  The patient's deep tendon reflexes were depressed throughout with downgoing toes bilaterally.  LABORATORY DATA:  White blood cell count 5.1, hemoglobin 8.7, hematocrit 25.6, and platelets 355,000.  Sodium 135, potassium 4.3, chloride 108, CO2 of 20, BUN 11, creatinine 1.61, and glucose is 131.  Ferritin is 589, iron is 30.  C diff negative.  RPR negative.  Vitamin B12 is 421. TSH is 3.33.  Urinalysis showed positive for staph species.  IMAGING:  CT of head showed mild atrophy.  MRI of brain showed no acute infarct, minimal nonspecific white matter type changes, global atrophy without hydrocephalus.  MRA brain showed significant motion degraded but positive flow in the internal carotid arteries bilaterally, vertebral arteries bilaterally, and basilar artery.  ASSESSMENT AND PLAN:  This is a 69-year African-American female with confusion in the setting of urinary tract infection.  The patient's B12, RPR, and TSH laboratory values were all within normal limits.  The patient's magnetic resonance imaging was negative for stroke or abnormality.  Most likely, this represents confusion in the setting of urinary tract infection.  At this time, would recommend treating underlying  urinary tract infection.  However, would consider an alternative to Cipro as Cipro can cause confusion in elderly patients. I will discuss these findings with Dr. Anne Hahn.     Felicie Morn, PA-C   ______________________________ C. Lesia Sago, M.D.    DS/MEDQ  D:  07/27/2010  T:  07/27/2010  Job:  [id number]  Electronically Signed by Felicie Morn PA-C on 08/05/2010 12:50:58 PM Electronically Signed by Thana Farr M.D. on 08/09/2010 08:17:13 AM

## 2010-08-26 ENCOUNTER — Encounter: Payer: Self-pay | Admitting: Licensed Clinical Social Worker

## 2010-08-26 ENCOUNTER — Ambulatory Visit: Payer: Medicaid Other | Attending: Radiation Oncology | Admitting: Radiation Oncology

## 2010-08-27 ENCOUNTER — Ambulatory Visit: Payer: Medicaid Other | Attending: Radiation Oncology | Admitting: Radiation Oncology

## 2010-08-27 DIAGNOSIS — C37 Malignant neoplasm of thymus: Secondary | ICD-10-CM | POA: Insufficient documentation

## 2010-08-27 DIAGNOSIS — Z923 Personal history of irradiation: Secondary | ICD-10-CM | POA: Insufficient documentation

## 2010-08-27 LAB — CBC WITH DIFFERENTIAL/PLATELET
Basophils Absolute: 0 10*3/uL (ref 0.0–0.1)
Eosinophils Absolute: 0.5 10*3/uL (ref 0.0–0.5)
HGB: 10.2 g/dL — ABNORMAL LOW (ref 11.6–15.9)
MCV: 95.5 fL (ref 79.5–101.0)
MONO#: 0.7 10*3/uL (ref 0.1–0.9)
NEUT#: 8.6 10*3/uL — ABNORMAL HIGH (ref 1.5–6.5)
RDW: 16.1 % — ABNORMAL HIGH (ref 11.2–14.5)
lymph#: 0.6 10*3/uL — ABNORMAL LOW (ref 0.9–3.3)

## 2010-08-27 LAB — BASIC METABOLIC PANEL
BUN: 15 mg/dL (ref 6–23)
CO2: 21 mEq/L (ref 19–32)
Chloride: 104 mEq/L (ref 96–112)
Glucose, Bld: 95 mg/dL (ref 70–99)
Potassium: 3.3 mEq/L — ABNORMAL LOW (ref 3.5–5.3)

## 2010-08-30 ENCOUNTER — Ambulatory Visit (HOSPITAL_COMMUNITY)
Admission: RE | Admit: 2010-08-30 | Discharge: 2010-08-30 | Disposition: A | Discharge status: Home / Self Care | Payer: Medicare Other | Source: Ambulatory Visit | Attending: Hematology & Oncology | Admitting: Hematology & Oncology

## 2010-08-30 DIAGNOSIS — I2510 Atherosclerotic heart disease of native coronary artery without angina pectoris: Secondary | ICD-10-CM | POA: Insufficient documentation

## 2010-08-30 DIAGNOSIS — K439 Ventral hernia without obstruction or gangrene: Secondary | ICD-10-CM | POA: Insufficient documentation

## 2010-08-30 DIAGNOSIS — J438 Other emphysema: Secondary | ICD-10-CM | POA: Insufficient documentation

## 2010-08-30 DIAGNOSIS — Z85038 Personal history of other malignant neoplasm of large intestine: Secondary | ICD-10-CM | POA: Insufficient documentation

## 2010-08-30 DIAGNOSIS — C73 Malignant neoplasm of thyroid gland: Secondary | ICD-10-CM

## 2010-08-30 DIAGNOSIS — J984 Other disorders of lung: Secondary | ICD-10-CM | POA: Insufficient documentation

## 2010-08-30 DIAGNOSIS — C37 Malignant neoplasm of thymus: Secondary | ICD-10-CM | POA: Insufficient documentation

## 2010-09-01 LAB — COMPREHENSIVE METABOLIC PANEL
ALT: 10 U/L (ref 0–35)
ALT: 13 U/L (ref 0–35)
AST: 19 U/L (ref 0–37)
AST: 19 U/L (ref 0–37)
AST: 27 U/L (ref 0–37)
Albumin: 1.8 g/dL — ABNORMAL LOW (ref 3.5–5.2)
Albumin: 2.1 g/dL — ABNORMAL LOW (ref 3.5–5.2)
Albumin: 3.1 g/dL — ABNORMAL LOW (ref 3.5–5.2)
Alkaline Phosphatase: 40 U/L (ref 39–117)
Alkaline Phosphatase: 61 U/L (ref 39–117)
BUN: 17 mg/dL (ref 6–23)
BUN: 5 mg/dL — ABNORMAL LOW (ref 6–23)
CO2: 19 mEq/L (ref 19–32)
CO2: 20 mEq/L (ref 19–32)
CO2: 24 mEq/L (ref 19–32)
Calcium: 7.9 mg/dL — ABNORMAL LOW (ref 8.4–10.5)
Calcium: 9.6 mg/dL (ref 8.4–10.5)
Chloride: 106 mEq/L (ref 96–112)
Chloride: 108 mEq/L (ref 96–112)
Chloride: 96 mEq/L (ref 96–112)
Creatinine, Ser: 1.62 mg/dL — ABNORMAL HIGH (ref 0.4–1.2)
GFR calc Af Amer: 17 mL/min — ABNORMAL LOW (ref >60)
GFR calc Af Amer: 39 mL/min — ABNORMAL LOW (ref >60)
GFR calc non Af Amer: 14 mL/min — ABNORMAL LOW (ref >60)
GFR calc non Af Amer: 32 mL/min — ABNORMAL LOW (ref >60)
Glucose, Bld: 99 mg/dL (ref 70–99)
Potassium: 3.2 mEq/L — ABNORMAL LOW (ref 3.5–5.1)
Potassium: 3.5 mEq/L (ref 3.5–5.1)
Sodium: 134 mEq/L — ABNORMAL LOW (ref 135–145)
Sodium: 135 mEq/L (ref 135–145)
Total Bilirubin: 0.5 mg/dL (ref 0.3–1.2)
Total Bilirubin: 0.7 mg/dL (ref 0.3–1.2)
Total Bilirubin: 1.2 mg/dL (ref 0.3–1.2)
Total Protein: 5 g/dL — ABNORMAL LOW (ref 6.0–8.3)

## 2010-09-01 LAB — CBC
HCT: 24.3 % — ABNORMAL LOW (ref 36.0–46.0)
HCT: 26.5 % — ABNORMAL LOW (ref 36.0–46.0)
HCT: 27.6 % — ABNORMAL LOW (ref 36.0–46.0)
HCT: 27.8 % — ABNORMAL LOW (ref 36.0–46.0)
HCT: 28.6 % — ABNORMAL LOW (ref 36.0–46.0)
HCT: 28.8 % — ABNORMAL LOW (ref 36.0–46.0)
HCT: 29.3 % — ABNORMAL LOW (ref 36.0–46.0)
HCT: 31.8 % — ABNORMAL LOW (ref 36.0–46.0)
HCT: 33.2 % — ABNORMAL LOW (ref 36.0–46.0)
HCT: 34.5 % — ABNORMAL LOW (ref 36.0–46.0)
Hemoglobin: 10.5 g/dL — ABNORMAL LOW (ref 12.0–15.0)
Hemoglobin: 11.4 g/dL — ABNORMAL LOW (ref 12.0–15.0)
Hemoglobin: 12.1 g/dL (ref 12.0–15.0)
Hemoglobin: 8 g/dL — ABNORMAL LOW (ref 12.0–15.0)
Hemoglobin: 9 g/dL — ABNORMAL LOW (ref 12.0–15.0)
Hemoglobin: 9 g/dL — ABNORMAL LOW (ref 12.0–15.0)
Hemoglobin: 9.4 g/dL — ABNORMAL LOW (ref 12.0–15.0)
MCH: 28.8 pg (ref 26.0–34.0)
MCH: 28.9 pg (ref 26.0–34.0)
MCH: 28.9 pg (ref 26.0–34.0)
MCH: 29.1 pg (ref 26.0–34.0)
MCH: 29.1 pg (ref 26.0–34.0)
MCH: 29.2 pg (ref 26.0–34.0)
MCH: 29.2 pg (ref 26.0–34.0)
MCH: 29.3 pg (ref 26.0–34.0)
MCH: 29.3 pg (ref 26.0–34.0)
MCH: 29.3 pg (ref 26.0–34.0)
MCH: 29.6 pg (ref 26.0–34.0)
MCHC: 32.1 g/dL (ref 30.0–36.0)
MCHC: 32.2 g/dL (ref 30.0–36.0)
MCHC: 32.3 g/dL (ref 30.0–36.0)
MCHC: 32.4 g/dL (ref 30.0–36.0)
MCHC: 32.6 g/dL (ref 30.0–36.0)
MCHC: 32.9 g/dL (ref 30.0–36.0)
MCHC: 32.9 g/dL (ref 30.0–36.0)
MCHC: 33 g/dL (ref 30.0–36.0)
MCHC: 33.1 g/dL (ref 30.0–36.0)
MCV: 87.6 fL (ref 78.0–100.0)
MCV: 87.7 fL (ref 78.0–100.0)
MCV: 88.5 fL (ref 78.0–100.0)
MCV: 88.8 fL (ref 78.0–100.0)
MCV: 88.9 fL (ref 78.0–100.0)
MCV: 88.9 fL (ref 78.0–100.0)
MCV: 89 fL (ref 78.0–100.0)
MCV: 89.1 fL (ref 78.0–100.0)
MCV: 89.6 fL (ref 78.0–100.0)
Platelets: 293 10*3/uL (ref 150–400)
Platelets: 333 10*3/uL (ref 150–400)
Platelets: 355 10*3/uL (ref 150–400)
Platelets: 370 10*3/uL (ref 150–400)
Platelets: 372 10*3/uL (ref 150–400)
Platelets: 376 10*3/uL (ref 150–400)
Platelets: 380 10*3/uL (ref 150–400)
Platelets: 394 10*3/uL (ref 150–400)
Platelets: 402 10*3/uL — ABNORMAL HIGH (ref 150–400)
Platelets: 425 10*3/uL — ABNORMAL HIGH (ref 150–400)
Platelets: 447 10*3/uL — ABNORMAL HIGH (ref 150–400)
RBC: 2.73 MIL/uL — ABNORMAL LOW (ref 3.87–5.11)
RBC: 3.08 MIL/uL — ABNORMAL LOW (ref 3.87–5.11)
RBC: 3.12 MIL/uL — ABNORMAL LOW (ref 3.87–5.11)
RBC: 3.14 MIL/uL — ABNORMAL LOW (ref 3.87–5.11)
RBC: 3.23 MIL/uL — ABNORMAL LOW (ref 3.87–5.11)
RBC: 3.42 MIL/uL — ABNORMAL LOW (ref 3.87–5.11)
RBC: 3.58 MIL/uL — ABNORMAL LOW (ref 3.87–5.11)
RBC: 3.94 MIL/uL (ref 3.87–5.11)
RBC: 4.23 MIL/uL (ref 3.87–5.11)
RDW: 12.8 % (ref 11.5–15.5)
RDW: 13 % (ref 11.5–15.5)
RDW: 13.4 % (ref 11.5–15.5)
RDW: 13.6 % (ref 11.5–15.5)
RDW: 13.8 % (ref 11.5–15.5)
RDW: 13.9 % (ref 11.5–15.5)
RDW: 14.1 % (ref 11.5–15.5)
RDW: 14.1 % (ref 11.5–15.5)
RDW: 14.1 % (ref 11.5–15.5)
RDW: 14.2 % (ref 11.5–15.5)
WBC: 10.5 10*3/uL (ref 4.0–10.5)
WBC: 10.7 10*3/uL — ABNORMAL HIGH (ref 4.0–10.5)
WBC: 12.7 10*3/uL — ABNORMAL HIGH (ref 4.0–10.5)
WBC: 12.9 10*3/uL — ABNORMAL HIGH (ref 4.0–10.5)
WBC: 7.1 10*3/uL (ref 4.0–10.5)
WBC: 9.7 10*3/uL (ref 4.0–10.5)
WBC: 9.9 10*3/uL (ref 4.0–10.5)

## 2010-09-01 LAB — BASIC METABOLIC PANEL
BUN: 10 mg/dL (ref 6–23)
BUN: 10 mg/dL (ref 6–23)
BUN: 11 mg/dL (ref 6–23)
BUN: 17 mg/dL (ref 6–23)
BUN: 4 mg/dL — ABNORMAL LOW (ref 6–23)
BUN: 5 mg/dL — ABNORMAL LOW (ref 6–23)
BUN: 5 mg/dL — ABNORMAL LOW (ref 6–23)
BUN: 6 mg/dL (ref 6–23)
BUN: 7 mg/dL (ref 6–23)
BUN: 8 mg/dL (ref 6–23)
CO2: 19 mEq/L (ref 19–32)
CO2: 20 mEq/L (ref 19–32)
CO2: 20 mEq/L (ref 19–32)
CO2: 20 mEq/L (ref 19–32)
CO2: 20 mEq/L (ref 19–32)
CO2: 21 mEq/L (ref 19–32)
CO2: 21 mEq/L (ref 19–32)
CO2: 22 mEq/L (ref 19–32)
CO2: 23 mEq/L (ref 19–32)
CO2: 24 mEq/L (ref 19–32)
CO2: 25 mEq/L (ref 19–32)
Calcium: 7.9 mg/dL — ABNORMAL LOW (ref 8.4–10.5)
Calcium: 8.1 mg/dL — ABNORMAL LOW (ref 8.4–10.5)
Calcium: 8.2 mg/dL — ABNORMAL LOW (ref 8.4–10.5)
Calcium: 8.4 mg/dL (ref 8.4–10.5)
Calcium: 8.4 mg/dL (ref 8.4–10.5)
Calcium: 8.5 mg/dL (ref 8.4–10.5)
Calcium: 8.7 mg/dL (ref 8.4–10.5)
Calcium: 8.8 mg/dL (ref 8.4–10.5)
Calcium: 8.9 mg/dL (ref 8.4–10.5)
Chloride: 101 mEq/L (ref 96–112)
Chloride: 104 mEq/L (ref 96–112)
Chloride: 106 mEq/L (ref 96–112)
Chloride: 107 mEq/L (ref 96–112)
Chloride: 108 mEq/L (ref 96–112)
Chloride: 109 mEq/L (ref 96–112)
Chloride: 110 mEq/L (ref 96–112)
Chloride: 110 mEq/L (ref 96–112)
Chloride: 111 mEq/L (ref 96–112)
Creatinine, Ser: 1.6 mg/dL — ABNORMAL HIGH (ref 0.4–1.2)
Creatinine, Ser: 1.69 mg/dL — ABNORMAL HIGH (ref 0.4–1.2)
Creatinine, Ser: 1.72 mg/dL — ABNORMAL HIGH (ref 0.4–1.2)
Creatinine, Ser: 1.85 mg/dL — ABNORMAL HIGH (ref 0.4–1.2)
Creatinine, Ser: 1.98 mg/dL — ABNORMAL HIGH (ref 0.4–1.2)
Creatinine, Ser: 2 mg/dL — ABNORMAL HIGH (ref 0.4–1.2)
Creatinine, Ser: 2.3 mg/dL — ABNORMAL HIGH (ref 0.4–1.2)
Creatinine, Ser: 2.42 mg/dL — ABNORMAL HIGH (ref 0.4–1.2)
Creatinine, Ser: 2.61 mg/dL — ABNORMAL HIGH (ref 0.4–1.2)
Creatinine, Ser: 2.85 mg/dL — ABNORMAL HIGH (ref 0.4–1.2)
Creatinine, Ser: 2.86 mg/dL — ABNORMAL HIGH (ref 0.4–1.2)
GFR calc Af Amer: 18 mL/min — ABNORMAL LOW (ref >60)
GFR calc Af Amer: 20 mL/min — ABNORMAL LOW (ref >60)
GFR calc Af Amer: 20 mL/min — ABNORMAL LOW (ref >60)
GFR calc Af Amer: 33 mL/min — ABNORMAL LOW (ref >60)
GFR calc Af Amer: 36 mL/min — ABNORMAL LOW (ref >60)
GFR calc Af Amer: 36 mL/min — ABNORMAL LOW (ref >60)
GFR calc Af Amer: 39 mL/min — ABNORMAL LOW (ref >60)
GFR calc Af Amer: 39 mL/min — ABNORMAL LOW (ref >60)
GFR calc non Af Amer: 17 mL/min — ABNORMAL LOW (ref >60)
GFR calc non Af Amer: 18 mL/min — ABNORMAL LOW (ref >60)
GFR calc non Af Amer: 20 mL/min — ABNORMAL LOW (ref >60)
GFR calc non Af Amer: 21 mL/min — ABNORMAL LOW (ref >60)
GFR calc non Af Amer: 22 mL/min — ABNORMAL LOW (ref >60)
GFR calc non Af Amer: 30 mL/min — ABNORMAL LOW (ref >60)
GFR calc non Af Amer: 30 mL/min — ABNORMAL LOW (ref >60)
GFR calc non Af Amer: 32 mL/min — ABNORMAL LOW (ref >60)
Glucose, Bld: 117 mg/dL — ABNORMAL HIGH (ref 70–99)
Glucose, Bld: 119 mg/dL — ABNORMAL HIGH (ref 70–99)
Glucose, Bld: 147 mg/dL — ABNORMAL HIGH (ref 70–99)
Glucose, Bld: 161 mg/dL — ABNORMAL HIGH (ref 70–99)
Glucose, Bld: 83 mg/dL (ref 70–99)
Glucose, Bld: 84 mg/dL (ref 70–99)
Glucose, Bld: 84 mg/dL (ref 70–99)
Glucose, Bld: 85 mg/dL (ref 70–99)
Glucose, Bld: 86 mg/dL (ref 70–99)
Glucose, Bld: 87 mg/dL (ref 70–99)
Glucose, Bld: 91 mg/dL (ref 70–99)
Glucose, Bld: 94 mg/dL (ref 70–99)
Potassium: 2.6 mEq/L — CL (ref 3.5–5.1)
Potassium: 3.2 mEq/L — ABNORMAL LOW (ref 3.5–5.1)
Potassium: 3.3 mEq/L — ABNORMAL LOW (ref 3.5–5.1)
Potassium: 3.4 mEq/L — ABNORMAL LOW (ref 3.5–5.1)
Potassium: 3.6 mEq/L (ref 3.5–5.1)
Potassium: 3.9 mEq/L (ref 3.5–5.1)
Sodium: 134 mEq/L — ABNORMAL LOW (ref 135–145)
Sodium: 134 mEq/L — ABNORMAL LOW (ref 135–145)
Sodium: 135 mEq/L (ref 135–145)
Sodium: 136 mEq/L (ref 135–145)
Sodium: 136 mEq/L (ref 135–145)
Sodium: 137 mEq/L (ref 135–145)
Sodium: 137 mEq/L (ref 135–145)

## 2010-09-01 LAB — DIFFERENTIAL
Basophils Relative: 0 % (ref 0–1)
Eosinophils Absolute: 0.2 10*3/uL (ref 0.0–0.7)
Eosinophils Absolute: 0.4 10*3/uL (ref 0.0–0.7)
Eosinophils Relative: 2 % (ref 0–5)
Eosinophils Relative: 4 % (ref 0–5)
Lymphs Abs: 0.7 10*3/uL (ref 0.7–4.0)
Lymphs Abs: 1.1 10*3/uL (ref 0.7–4.0)
Monocytes Absolute: 0.9 10*3/uL (ref 0.1–1.0)
Monocytes Absolute: 1.1 10*3/uL — ABNORMAL HIGH (ref 0.1–1.0)
Monocytes Relative: 10 % (ref 3–12)
Neutrophils Relative %: 79 % — ABNORMAL HIGH (ref 43–77)

## 2010-09-01 LAB — PHOSPHORUS
Phosphorus: 2.2 mg/dL — ABNORMAL LOW (ref 2.3–4.6)
Phosphorus: 2.9 mg/dL (ref 2.3–4.6)
Phosphorus: 2.9 mg/dL (ref 2.3–4.6)
Phosphorus: 3 mg/dL (ref 2.3–4.6)
Phosphorus: 3.1 mg/dL (ref 2.3–4.6)
Phosphorus: 3.2 mg/dL (ref 2.3–4.6)
Phosphorus: 3.4 mg/dL (ref 2.3–4.6)
Phosphorus: 3.5 mg/dL (ref 2.3–4.6)

## 2010-09-01 LAB — URINE MICROSCOPIC-ADD ON

## 2010-09-01 LAB — UIFE/LIGHT CHAINS/TP QN, 24-HR UR
Free Lambda Lt Chains,Ur: 12.8 mg/dL — ABNORMAL HIGH (ref 0.08–1.01)
Total Protein, Urine: 64.2 mg/dL

## 2010-09-01 LAB — SEDIMENTATION RATE: Sed Rate: 96 mm/hr — ABNORMAL HIGH (ref 0–22)

## 2010-09-01 LAB — URINALYSIS, ROUTINE W REFLEX MICROSCOPIC
Bilirubin Urine: NEGATIVE
Nitrite: NEGATIVE
Nitrite: NEGATIVE
Specific Gravity, Urine: 1.01 (ref 1.005–1.030)
Specific Gravity, Urine: 1.016 (ref 1.005–1.030)
Urobilinogen, UA: 1 mg/dL (ref 0.0–1.0)
pH: 7 (ref 5.0–8.0)
pH: 7 (ref 5.0–8.0)

## 2010-09-01 LAB — RENAL FUNCTION PANEL
BUN: 4 mg/dL — ABNORMAL LOW (ref 6–23)
BUN: 5 mg/dL — ABNORMAL LOW (ref 6–23)
CO2: 22 mEq/L (ref 19–32)
Calcium: 8.3 mg/dL — ABNORMAL LOW (ref 8.4–10.5)
Calcium: 8.9 mg/dL (ref 8.4–10.5)
Chloride: 106 mEq/L (ref 96–112)
Creatinine, Ser: 2.01 mg/dL — ABNORMAL HIGH (ref 0.4–1.2)
Creatinine, Ser: 2.04 mg/dL — ABNORMAL HIGH (ref 0.4–1.2)
GFR calc Af Amer: 30 mL/min — ABNORMAL LOW (ref >60)
GFR calc non Af Amer: 25 mL/min — ABNORMAL LOW (ref >60)
Glucose, Bld: 78 mg/dL (ref 70–99)
Glucose, Bld: 90 mg/dL (ref 70–99)
Phosphorus: 2.8 mg/dL (ref 2.3–4.6)

## 2010-09-01 LAB — CREATININE, URINE, RANDOM
Creatinine, Urine: 155.5 mg/dL
Creatinine, Urine: 71.1 mg/dL

## 2010-09-01 LAB — PROTEIN ELECTROPHORESIS, SERUM
Alpha-2-Globulin: 15.7 % — ABNORMAL HIGH (ref 7.1–11.8)
Gamma Globulin: 18.3 % (ref 11.1–18.8)

## 2010-09-01 LAB — POCT I-STAT 3, ART BLOOD GAS (G3+)
Bicarbonate: 19.8 mEq/L — ABNORMAL LOW (ref 20.0–24.0)
Bicarbonate: 20.4 mEq/L (ref 20.0–24.0)
O2 Saturation: 99 %
Patient temperature: 99.2
TCO2: 21 mmol/L (ref 0–100)
TCO2: 22 mmol/L (ref 0–100)
pCO2 arterial: 39.2 mmHg (ref 35.0–45.0)
pH, Arterial: 7.306 — ABNORMAL LOW (ref 7.350–7.400)
pO2, Arterial: 140 mmHg — ABNORMAL HIGH (ref 80.0–100.0)

## 2010-09-01 LAB — TYPE AND SCREEN
ABO/RH(D): O POS
Antibody Screen: NEGATIVE
Unit division: 0
Unit division: 0

## 2010-09-01 LAB — URINE CULTURE: Culture  Setup Time: 201111231411

## 2010-09-01 LAB — URINALYSIS, MICROSCOPIC ONLY
Bilirubin Urine: NEGATIVE
Glucose, UA: 100 mg/dL — AB
Ketones, ur: 15 mg/dL — AB
Protein, ur: 30 mg/dL — AB

## 2010-09-01 LAB — HIV ANTIBODY (ROUTINE TESTING W REFLEX): HIV: NONREACTIVE

## 2010-09-01 LAB — MAGNESIUM
Magnesium: 1.3 mg/dL — ABNORMAL LOW (ref 1.5–2.5)
Magnesium: 1.4 mg/dL — ABNORMAL LOW (ref 1.5–2.5)
Magnesium: 1.7 mg/dL (ref 1.5–2.5)
Magnesium: 1.9 mg/dL (ref 1.5–2.5)
Magnesium: 1.9 mg/dL (ref 1.5–2.5)
Magnesium: 1.9 mg/dL (ref 1.5–2.5)
Magnesium: 2 mg/dL (ref 1.5–2.5)
Magnesium: 2.3 mg/dL (ref 1.5–2.5)

## 2010-09-01 LAB — VITAMIN D 1,25 DIHYDROXY: Vitamin D3 1, 25 (OH)2: <8 pg/mL

## 2010-09-01 LAB — LIPID PANEL
Cholesterol: 173 mg/dL (ref 0–200)
HDL: 26 mg/dL — ABNORMAL LOW (ref >39)
LDL Cholesterol: 97 mg/dL (ref 0–99)
Total CHOL/HDL Ratio: 6.7 RATIO
VLDL: 50 mg/dL — ABNORMAL HIGH (ref 0–40)

## 2010-09-01 LAB — VITAMIN B12: Vitamin B-12: 562 pg/mL (ref 211–911)

## 2010-09-01 LAB — TSH: TSH: 1.039 u[IU]/mL (ref 0.350–4.500)

## 2010-09-01 LAB — IRON: Iron: 54 ug/dL (ref 42–135)

## 2010-09-01 LAB — CARDIAC PANEL(CRET KIN+CKTOT+MB+TROPI): CK, MB: 1.1 ng/mL (ref 0.3–4.0)

## 2010-09-01 LAB — PROTIME-INR
INR: 1.28 (ref 0.00–1.49)
Prothrombin Time: 16.2 seconds — ABNORMAL HIGH (ref 11.6–15.2)

## 2010-09-01 LAB — CULTURE, BLOOD (ROUTINE X 2)

## 2010-09-01 LAB — PROTEIN / CREATININE RATIO, URINE: Protein Creatinine Ratio: 0.88 — ABNORMAL HIGH (ref 0.00–0.15)

## 2010-09-01 LAB — SODIUM, URINE, RANDOM: Sodium, Ur: 20 mEq/L

## 2010-09-13 ENCOUNTER — Encounter: Payer: Self-pay | Admitting: Pulmonary Disease

## 2010-09-14 ENCOUNTER — Ambulatory Visit: Payer: Medicare Other | Admitting: Pulmonary Disease

## 2010-09-15 ENCOUNTER — Ambulatory Visit: Payer: Medicare Other | Attending: Radiation Oncology | Admitting: Radiation Oncology

## 2010-09-22 ENCOUNTER — Ambulatory Visit: Payer: Self-pay | Admitting: Pulmonary Disease

## 2010-09-28 ENCOUNTER — Other Ambulatory Visit: Payer: Self-pay | Admitting: Endocrinology

## 2010-09-28 DIAGNOSIS — E041 Nontoxic single thyroid nodule: Secondary | ICD-10-CM

## 2010-10-08 ENCOUNTER — Encounter: Payer: Self-pay | Admitting: Pulmonary Disease

## 2010-10-12 ENCOUNTER — Ambulatory Visit: Payer: Medicare Other | Admitting: Pulmonary Disease

## 2010-10-13 ENCOUNTER — Inpatient Hospital Stay: Admission: RE | Admit: 2010-10-13 | Payer: Medicare Other | Source: Ambulatory Visit

## 2010-10-21 ENCOUNTER — Ambulatory Visit (INDEPENDENT_AMBULATORY_CARE_PROVIDER_SITE_OTHER): Payer: Medicare Other | Admitting: Pulmonary Disease

## 2010-10-21 ENCOUNTER — Encounter: Payer: Self-pay | Admitting: Pulmonary Disease

## 2010-10-21 VITALS — BP 90/60 | HR 87 | Temp 98.1°F | Ht 66.0 in | Wt 111.2 lb

## 2010-10-21 DIAGNOSIS — J438 Other emphysema: Secondary | ICD-10-CM

## 2010-10-21 DIAGNOSIS — C37 Malignant neoplasm of thymus: Secondary | ICD-10-CM | POA: Insufficient documentation

## 2010-10-21 MED ORDER — ALBUTEROL SULFATE HFA 108 (90 BASE) MCG/ACT IN AERS: 2.0000 | INHALATION_SPRAY | Freq: Four times a day (QID) | RESPIRATORY_TRACT | Status: DC | PRN

## 2010-10-21 MED ORDER — BUDESONIDE-FORMOTEROL FUMARATE 160-4.5 MCG/ACT IN AERO: 2.0000 | INHALATION_SPRAY | Freq: Two times a day (BID) | RESPIRATORY_TRACT | Status: DC

## 2010-10-21 MED ORDER — TIOTROPIUM BROMIDE MONOHYDRATE 18 MCG IN CAPS: 18.0000 ug | ORAL_CAPSULE | Freq: Every day | RESPIRATORY_TRACT | Status: DC

## 2010-10-21 NOTE — Patient Instructions (Signed)
Stay on symbicort and spiriva Work on increasing calorie intake, and some type of conditioning program followup with me in 6mos

## 2010-10-21 NOTE — Assessment & Plan Note (Signed)
The pt has severe emphysema, but is not far from her usual baseline despite running out of her meds.  I have given her prescriptions, and asked her to stay on these as much as possible.  I have also encouraged her to work on some type of conditioning program.

## 2010-10-21 NOTE — Progress Notes (Signed)
  Subjective:    Patient ID: [name], female    DOB: [birth year], 70 y.o.   MRN: [id number]  HPI The pt comes in today for f/u of her known severe emphysema.  She has run out of her meds, and needs prescriptions for these.  She has chronic doe, but feels it is near her usual baseline.  She reports no recent acute exacerbation, and denies chest congestion or purulence.     Review of Systems  Constitutional: Positive for unexpected weight change. Negative for fever.  HENT: Positive for congestion and sore throat. Negative for ear pain, nosebleeds, rhinorrhea, sneezing, trouble swallowing, dental problem, postnasal drip and sinus pressure.   Eyes: Negative for redness and itching.  Respiratory: Positive for shortness of breath. Negative for cough, chest tightness and wheezing.   Cardiovascular: Negative for palpitations and leg swelling.  Gastrointestinal: Negative for nausea and vomiting.  Genitourinary: Negative for dysuria.  Musculoskeletal: Negative for joint swelling.  Skin: Negative for rash.  Neurological: Negative for headaches.  Hematological: Does not bruise/bleed easily.  Psychiatric/Behavioral: Negative for dysphoric mood. The patient is not nervous/anxious.        Objective:   Physical Exam Thin female in nad  Chest with decreased bs, no wheezing or rhonchi Cor with rrr No LE edema, no cyanosis Alert and oriented, moves all 4        Assessment & Plan:

## 2010-10-27 ENCOUNTER — Ambulatory Visit
Admission: RE | Admit: 2010-10-27 | Discharge: 2010-10-27 | Disposition: A | Discharge status: Home / Self Care | Payer: Medicare Other | Source: Ambulatory Visit | Attending: Endocrinology | Admitting: Endocrinology

## 2010-10-27 ENCOUNTER — Other Ambulatory Visit (HOSPITAL_COMMUNITY)
Admission: RE | Admit: 2010-10-27 | Discharge: 2010-10-27 | Disposition: A | Discharge status: Home / Self Care | Payer: Medicare Other | Source: Ambulatory Visit | Attending: Interventional Radiology | Admitting: Interventional Radiology

## 2010-10-27 ENCOUNTER — Other Ambulatory Visit: Payer: Self-pay | Admitting: Interventional Radiology

## 2010-10-27 DIAGNOSIS — E041 Nontoxic single thyroid nodule: Secondary | ICD-10-CM

## 2010-10-27 DIAGNOSIS — E049 Nontoxic goiter, unspecified: Secondary | ICD-10-CM | POA: Insufficient documentation

## 2010-11-02 NOTE — Assessment & Plan Note (Signed)
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  She also had renal insufficiency at original presentation,  however, this resolved prior to surgery.  Since discharge, she has been  doing relatively well, though she has some nausea and not much appetite.  She is walking around her house, but still weak.  She notes she is able  to swallow, but is not eating much mostly because of poor appetite.   PHYSICAL EXAMINATION:  Her blood pressure is 107/72, pulse is 102,  respiratory rate is 22, and O2 sats 98%.  Her weight is 121 pounds.  On  exam, her lungs are clear bilaterally.  Cardiac exam reveals a regular  rate and rhythm.  The upper sternal incision is healing well.  The  sternum is stable.  She has no calf tenderness or lower extremity edema.   DIAGNOSTIC TESTS:  Followup chest x-ray shows clear lung fields  bilaterally.   IMPRESSION:  Overall, she seems to be doing adequate, but slow following  her thymic tumor resection.  Most of her difficulty appears to revolve  around her chronic medical conditions.  She did see Dr. Roselind Messier last  several days ago to start planning her postoperative radiation therapy,  though he plans to wait several weeks to let her gain more strength  after hospitalization before starting radiation.  I have made an  appointment to see me back with a chest x-ray in 8 weeks.   Sheliah Plane, MD  Electronically Signed   EG/MEDQ  D:  05/27/2010  T:  05/28/2010  Job:  [id number]   cc:   Billie Lade, M.D.  Rose Phi. Myna Hidalgo, M.D.  Mariea Stable, MD  Cliffton Asters, M.D.

## 2010-11-05 NOTE — H&P (Signed)
NAMEGUILLERMO, Walters                         ACCOUNT NO.:  [id number]   MEDICAL RECORD NO.:  [id number]                   PATIENT TYPE:   LOCATION:                                       FACILITY:  [facility]   PHYSICIAN:  Veverly Fells. Altheimer, M.D.          DATE OF BIRTH:  08/17/1944   DATE OF ADMISSION:  06/29/2003  DATE OF DISCHARGE:                                HISTORY & PHYSICAL   REASON FOR ADMISSION:  Dyspnea, wheezing, cough, and hypoxemia.   HISTORY:  This is a 70 year old black female patient of [name], M.D. who  was in her usual health until she had onset on June 26, 2003 of sinus  congestion and onset on June 27, 2003 of dry cough followed by fever to  about 100 degrees later that day.  She had gradual onset June 28, 2003  (yesterday) of progressive dyspnea and increased cough which was still  nonproductive, associated with increased malaise.  She reports that she felt  as if she had a virus.  She states it was difficult for her to walk across  the room without stopping to catch her breath.  She did not have any  subjective wheezing.  She denies chest pain.  There has not been any lower  extremity edema.  She does have a significant smoking history at 1/4 to 1/2  pack per day for at least 20 years although she says that she quit three or  four months ago.  She came to the emergency room late last night because of  the increasing dyspnea especially with minimal exertion.  In the ER she was  noted to have bilateral wheezing, oxygen saturations of about 90%, COPD by  chest x-ray with arterial blood gases pH 7.44, PCO2 43, PO2 55 on room air.  Her symptoms have been much better since the first albuterol/Atrovent  nebulizer in the emergency room.  However, a subsequent oxygen saturation  fell to 88% on 2 liters when she was up to the bathroom in the emergency  room and she was therefore admitted due to the persistence of the hypoxemia  despite the initial improvement in  symptoms.  She also had prednisone 60 mg  p.o. in the emergency room.  She continues to feel much better.  She denies  any prior similar episodes or any prior COPD or asthma diagnosis.   PAST MEDICAL HISTORY:  1. Hypertension.  2. Dyslipidemia, mild.  3. Status post partial colectomy for colon cancer plus cholecystectomy in     January 2001, with no known recurrence.   No known drug allergies.   MEDICATIONS:  1. Ziac 10/6.25 mg daily.  2. Verapamil SR 180 mg daily.   FAMILY HISTORY:  Positive for diabetes.   SOCIAL HISTORY:  She lives with her mother.  Her smoking history is as  above.  She drinks alcohol rarely.   REVIEW OF SYSTEMS:  Otherwise negative.  Her appetite  good and her weight  stable to slightly increased.  EYES:  Negative.  ENT:  No complaints.  CARDIOPULMONARY:  No complaints except as above with current illness.  GASTROINTESTINAL:  No complaints.  GENITOURINARY:  No complaints.  MUSCULOSKELETAL:  No  complaints.  NEUROLOGICAL:  No complaints.   PHYSICAL EXAMINATION:  GENERAL:  Alert, pleasant 70 year old black female in  no acute distress.  VITAL SIGNS:  She has been afebrile since arrival in the emergency room.  Temperature now is 98.3 with heart rate on arrival to the floor 95 with  respiratory rate 20 and blood pressure 128/71.  Oxygen saturation is up to  96% on 2 liters.  Weight 154 pounds.  SKIN:  Negative.  HEENT:  Eyes normal externally with fundi not examined.  ENT negative except  tongue slightly dry.  NECK:  Supple without thyromegaly with carotid upstrokes normal without  bruit.  LUNGS:  Unlabored and currently clear to auscultation.  HEART:  Regular without murmur.  ABDOMEN:  Soft, nontender, no mass.  EXTREMITIES:  Pedal pulses normal with no pedal edema.  NEUROLOGICAL:  Without focal deficit.   LABORATORY DATA:  ABGs as above.  CK in the emergency room is somewhat  elevated at 264 with MB high at 9.9 and relative index high at 3.8, but two   other cardiac enzyme profiles were normal in the emergency room.  BNP 176,  with normal less than 100.  D-dimer negative.  WBC 14.3 with 83 neutrophils.  Hemoglobin 14.7.  Sodium 137, potassium 3.6, CO2 31, glucose 155, BUN 6,  creatinine 0.8.  Liver enzymes normal.   Chest x-ray showed hyperinflation by EDP phone report.   EKG showed normal sinus rhythm with right atrial enlargement and pulmonary  disease pattern with nonspecific ST abnormality.   ASSESSMENT:  1. Acute asthmatic bronchitis, probably viral.  2. Upper respiratory infection, probably viral but cannot exclude bacterial     sinusitis.  3. Hypoxemia secondary to #1, although need to consider components of     underlying chronic obstructive pulmonary disease.  4. Hypertension in good control.  5. Initial CK-MB elevation on 1 of 3 cardiac enzyme profiles in the     emergency room, significance unclear.   PLAN:  Admitted to telemetry.  Treating with oxygen 2 liter by nasal  cannula, albuterol and Atrovent nebulizers and will start Zithromax.  She  has received prednisone in the emergency room and will continue with 20 mg  p.o. b.i.d.  Repeat cardiac enzymes later today and repeat EKG tomorrow  morning.  We will continue her usual blood pressure meds.  Consider PFTs  post recovery.                                                Veverly Fells. Altheimer, M.D.    MDA/MEDQ  D:  06/29/2003  T:  06/29/2003  Job:  [id number]   cc:   [name], M.D.  [address], [address]  [place]  [place] [address]  Fax: [phone]

## 2010-11-05 NOTE — Discharge Summary (Signed)
[place]  Patient:    [name], [name]                      MRN: [id number] Adm. Date:  06/29/99 Disc. Date: 07/10/99 Attending:  Sandria Bales. Ezzard Standing, M.D.                           Discharge Summary  DATE OF BIRTH:  August 17, 1944.  DISCHARGE DIAGNOSES: 1. 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 2. Chronic cholecystitis cholelithiasis. 3. Hypertension. 4. History of cigarette smoking.  OPERATIONS PERFORMED:  The patient had a laparoscopic cholecystectomy without cholangiogram and a left hemicolectomy on June 29, 1999.  HISTORY OF PRESENT ILLNESS:  Ms. [name] is a 70 year old black female who is a patient of [name], M.D., and Anselmo Rod, M.D., who has a biopsy proven adenocarcinoma of her splenic flexure.  She is also noted to have multiple gallstones.  PAST MEDICAL HISTORY:  Her significant past history is that she has a history of hypertension and she smokes cigarettes which she knows is bad for her health.  HOSPITAL COURSE:  She comes to the hospital for elective colon resection and cholecystectomy.  She completed a mechanical and antibiotic bowel prep at home. On the day of admission she underwent a laparoscopic cholecystectomy without a cholangiogram and underwent a left hemicolectomy.  Postoperatively, she did well.  By the second postoperative day her hemoglobin was 13, hematocrit 38, white blood cell count 12,800.  Her potassium was slightly depressed at 3.4.  Her hypokalemia was corrected.  Her NG tube was removed on the third postoperative day.  By the sixth postoperative day, she did develop a low grade fever, spiked as high as 103 by the seventh postoperative day, though her white blood cell count was 7900.  She was started empirically on Septra for her spiked temperature and she was clearly better by the  eighth postoperative day.  A CT scan revealed a left lower lobe pneumonia posteriorly and it was felt that the pneumonia was probably the source of the fever.  Her antibiotics were changed to Cefizox.  She was doing well from a GI standpoint and from her colon and her blood cultures remained negative.  By July 10, 1999, which was her 11th postoperative day, she was ready for discharge.  DISCHARGE INSTRUCTIONS: 1. Ceftin one tablet twice a day. 2. Vicodin for pain. 3. She was to do no lifting or driving. 4. She could shower. 5. See me back in one to two weeks.  PATHOLOGY:  Final pathology revealed chronic cholecystitis with cholelithiasis of her gallbladder and her left colon showed an invasive moderately differentiated adenocarcinoma into the submucosa but not through the submucosa with zero of two nodes involved.  She is a T1, N0 carcinoma.  DISCHARGE CONDITION:  Good. DD:  06/22/00 TD:  06/22/00 Job: [id number]

## 2010-11-05 NOTE — Op Note (Signed)
[place]. [facility]  Patient:    [name]                       MRN: [id number] Proc. Date: 06/29/99 Adm. Date:  [id number] Attending:  Andre Lefort CC:         [name], M.D.             Anselmo Rod, M.D.                           Operative Report  DATE OF BIRTH: August 17, 1944.  CCS# [id number]  PREOPERATIVE DIAGNOSIS:  Splenic flexure colon carcinoma, symptomatic cholelithiasis.  POSTOPERATIVE DIAGNOSIS:  Left colon carcinoma, symptomatic cholelithiasis.  OPERATION PERFORMED:  Laparoscopic cholecystectomy with no cholangiogram, left hemicolectomy with end-to-end left transverse to sigmoid colon anastomosis.  SURGEON:  Sandria Bales. Ezzard Standing, M.D.  ASSISTANT:  Abigail Miyamoto, M.D.  ANESTHESIA:  General endotracheal.  ESTIMATED BLOOD LOSS:  200 cc.  DRAINS:  None.  INDICATIONS FOR PROCEDURE:  [name] is a 70 year old black female who has een diagnosed with a colon cancer by colonoscopic biopsy by Dr. Loreta Ave where the tumor appears to be in the splenic flexure.  The patient also had noted gallstones and now comes for colon resection with cholecystectomy.  The patient completed a bowel prep at home and now comes to the operating room or surgery.  DESCRIPTION OF PROCEDURE:  The patient in supine position, had an NG tube passed and Foley catheter in place, PAS stockings in place.  Her abdomen was prepped with Betadine solution and sterilely draped.  An infraumbilical incision was made with sharp dissection and carried down into the abdominal cavity. A 0 degree laparoscope was inserted through a 12 mm Hasson trocar.  The Hasson trocar was secured with a 0 Vicryl suture.  Three additional trocars were placed, a 10 mm subxiphoid trocar, a 5 mm right midsubcostal, a 5 m right lateral subcostal location.  The patient was noted to have some adhesions  along the anterior abdominal wall.  These were taken down sharply with  scissors and hook Bovie coagulation. The gallbladder was identified.  The gallbladder itself had some adhesions and dissection was taken down identifying the infundibulum of the gallbladder.  The junction of the gallbladder cystic duct was identified.  The cystic duct wasn encircled.  The cystic artery was identified, triply endoclipped and divided.  I was able to easily identify the triangle of Calot and create a window in this area.  I confident the cystic duct went up to the gallbladder and there was no evidence of the common bile duct.  Since the patient had normal liver functions, really minimally if at all systematic, proceeded to just remove the gallbladder and did not do a cholangiogram.  The cystic duct was triply endoclipped and divided.  The gallbladder was then sharply and bluntly dissected from the gallbladder bed using primarily hook electrocoagulation.  Prior to complete division of the gallbladder from the gallbladder bed, the triangle of Calot was  visualized.  There was no bleeding or bile leak.  The gallbladder was then delivered through the umbilicus, the umbilical port closed with the 0 Vicryl suture.  Attention was then turned towards the colon.  A long  upper midline abdominal incision was made and sharp dissection carried down into the abdominal cavity.  The liver was unremarkable though lobulated.  There was o evidence of any  metastatic disease.  The gallbladder was absent with the bed dry. The stomach had NG tube in and was without mass.  The spleen was without mass. The small bowel was run from ligament of Treitz to the terminal ileum and was negative.  The right colon, transverse colon and left colon were all ____________  The colon was palpated.  The patient had about a 1.5 cm tumor which appeared to be in the middle of the left colon.  The patient had a very redundant sigmoid colon and a splenic flexure which pretty much folds in on  itself.  First I mobilized the left transverse colon, splenic flexure, left colon.  After this was immobilized, I found the spot where the tumor was.  I then resected approximately 10 inches of colon, resecting from the left transverse colon to the proximal sigmoid colon.  I then opened the specimen beside the operating room table, identified about a 1.5 cm tumor which looked very small and superficial. I think I got the cancer which Dr. Loreta Ave had identified.  She had no palpable adenopathy or other suspicious evidence of spread of the cancer.  After mobilization of the left colon, the mesentery was divided using Kelly clamps. The colon was divided and removed.  I opened the specimen on the table to identify the polyp.  The mesenteric defect was then closed with a running 2-0 chromic suture, the bowel itself was hand sewn end-to-end using 2-0 silk suture and this got a good anastomosis.  There was no tension on the anastomosis.  There some buttressing sutures placed.  I then replaced the colon to its normal  location.  The abdomen was then irrigated with thoroughly.  The inferior pole of the spleen he had about a 1 cm tear but this was controlled pretty well just with pressure and Bovie electrocautery.  The abdomen was then closed using running #1 PDS suture with interrupted #1 Novofil suture.  Again, the fascia was closed with running PDS suture and interrupted #1 Novofil  suture.  Skin closed with skin gun.  The wound was then sterilely dressed.  The  patient tolerated the procedure well and was transported to recovery room in good condition.  The sponge and needle counts were correct. DD:  06/29/99 TD:  06/29/99 Job: [id number]

## 2010-11-05 NOTE — Op Note (Signed)
[name], [name]                         ACCOUNT NO.:  [id number]   MEDICAL RECORD NO.:  [id number]                   PATIENT TYPE:  AMB   LOCATION:  ENDO                                 FACILITY:  [facility]   PHYSICIAN:  Anselmo Rod, M.D.               DATE OF BIRTH:  08/17/1944   DATE OF PROCEDURE:  05/15/2002  DATE OF DISCHARGE:                                 OPERATIVE REPORT   PROCEDURE PERFORMED:  Colonoscopy with snare polypectomy times two and cold  biopsy times eight.   ENDOSCOPIST:  Charna Elizabeth, M.D.   INSTRUMENT USED:  Pediatric adjustable Olympus colonoscope.   INDICATIONS FOR PROCEDURE:  The patient is a 70 year old African-American  female with a personal history of adenocarcinoma of the colon at 80 cm  removed by a partial colectomy involving the proximal left colon and the  distal transverse colon done by Dr. Ezzard Standing, presents with recurrent rectal  bleeding.  Rule out colonic polyps.  The patient has not had surveillance  since her surgery three years ago.   PREPROCEDURE PREPARATION:  Informed consent was procured from the patient.  The patient was fasted for eight hours prior to the procedure and prepped  with a bottle of magnesium citrate and a gallon of NuLytely the night prior  to the procedure.   PREPROCEDURE PHYSICAL:  The patient had stable vital signs.  Neck supple.  Chest clear to auscultation.  S1 and S2 regular.  No murmur, rub, gallop,  rales, rhonchi or wheezing.  Abdomen soft with a well healed surgical scar  present in the middle of the umbilicus, nontender with normal bowel sounds.   DESCRIPTION OF PROCEDURE:  The patient was placed in left lateral decubitus  position and sedated with 50 mg of Demerol and 5 mg of Versed intravenously.  Once the patient was adequately sedated and maintained on low flow oxygen  and continuous cardiac monitoring, the Olympus video colonoscope was  advanced from the rectum to the cecum with difficulty.  There  was some  residual stool in the colon.  Multiple washes were done.  A small sessile  polyp was snared from the cecal base.  Another small polyp was seen at 10  cm.  This was snared by snare polypectomy as well.  There was severe  inflammation seen from 0 to 15 cm.  The exact cause of this is not clear.  This could be an unusual form of IBD versus ischemic colitis.  Multiple  biopsies were done.  There was scattered diverticulosis seen in the right  colon.  The patient tolerated the procedure well without complications.   IMPRESSION:  1. Two polyps snared from the cecum and at 10 cm (see description above).  2. Right-sided diverticulosis.  3. Inflammation from 0 to 15 cm.  Biopsies done.  Exact reason for this is     unclear.    RECOMMENDATIONS:  1. Await pathology  results.  2. Avoid all nonsteroidals including aspirin for the next four to five     weeks.  3. Outpatient follow-up within the next seven to 10 days for further     recommendations.                                                   Anselmo Rod, M.D.    JNM/MEDQ  D:  05/15/2002  T:  05/15/2002  Job:  [id number]   cc:   [name], M.D.  [address], [address]  [place]  [place] [address]  Fax: [phone]

## 2010-11-05 NOTE — Discharge Summary (Signed)
NAMEYASLIN, KIRTLEY NO.:  [id number]   MEDICAL RECORD NO.:  [id number]                   PATIENT TYPE:  INP   LOCATION:  3735                                 FACILITY:  [facility]   PHYSICIAN:  [name], M.D.                    DATE OF BIRTH:  08/17/1944   DATE OF ADMISSION:  06/28/2003  DATE OF DISCHARGE:  07/01/2003                                 DISCHARGE SUMMARY   HISTORY:  The patient was admitted from the emergency room for dyspnea,  wheezing, cough and hypoxia for about three days prior to her admission.  Because of her significant hypoxia while she was in the emergency room she  was admitted and started on oxygen and further evaluation done.   MEDICATIONS:  See HPI.   PAST MEDICAL HISTORY:  See HPI.   FAMILY HISTORY:  See HPI.   SOCIAL HISTORY:  See HPI.   REVIEW OF SYMPTOMS:  See HPI.   PHYSICAL EXAMINATION:  BLOOD PRESSURE:  128/71  HEART RATE:  95  LUNGS:  Slightly dry.  Clear at the time for auscultation.   HOSPITAL COURSE:  The patient was given oxygen, albuterol and Atrovent  nebulizers and Zithromax.  She was also continued on prednisone, which was  started in the emergency room.  However, the patient continued to have some  cough and hypoxia.  Her oxygen level was reduced to 87% without oxygen and  she was kept until the 11th until she was stable to be off oxygen.   LABS:  Blood gases showed PO2 of 55, white count 14,300, repeat 9,700.  Glucose was 155 on admission and repeat was 151.  Cardiac enzymes were  negative, although CPK was slightly high on admission at 264 with MB 3.8.   Chest CT scan showed no evidence of pulmonary emboli.  There were thin  changes of severe bronchitis and some atelectasis.  Chest x-ray showed  hyperinflation with no active disease.  EKG showed no significant  abnormality.   DISCHARGE CONDITION:  Improved.   DISCHARGE MEDICATIONS:  Zithromax for two days.  Prednisone 20 mg for two  days and  then 10 mg for two days and to take only half dose of _________  daily and no verapamil.  Start OTC __________ and Claritin as well as  albuterol inhaler.   She will follow up in the office in two weeks.                                                [name], M.D.    AK/MEDQ  D:  07/29/2003  T:  07/29/2003  Job:  [id number]

## 2010-11-05 NOTE — Procedures (Signed)
[place]. [facility]  Patient:    [name]                       MRN: [id number] Proc. Date: 05/25/99 Adm. Date:  [id number] Attending:  Charna Elizabeth CC:         [name], M.D.             Sandria Bales. Ezzard Standing, M.D.                           Procedure Report  DATE OF BIRTH:  August 17, 1944  REFERRING PHYSICIAN:  [name], M.D.  PROCEDURE PERFORMED:  Colonoscopy with biopsies.  ENDOSCOPIST:  Anselmo Rod, M.D.  INSTRUMENT USED:  The Olympus video colonoscope.  INDICATIONS FOR PROCEDURE:  A 70 year old black female with guaiac positive stool and a family history of colon cancer, and her father was diagnosed in his 69s.  Rule-out polyps, AVMs, masses, hemorrhoids, etc.  PREPROCEDURE PREPARATION:  Informed consent was procured from the patient.  The  patient was fasted for eight hours prior to the procedure and prepped with a bottle of magnesium citrate and a gallon of NuLytely the night prior to the procedure.  PREPROCEDURE PHYSICAL EXAMINATION:  VITAL SIGNS:  The patient had stable vital signs.  NECK:  Supple.  CHEST:  Clear to auscultation.  HEART:  S1 and S2 regular.  ABDOMEN:  Soft with normal abdominal bowel sounds.  DESCRIPTION OF PROCEDURE:  The patient was placed in the left lateral decubitus  position and sedated with 70 mg of Demerol and 6 mg of Versed intravenously. Once the patient was adequately sedated and maintained on low-flow oxygen and continuous cardiac monitoring, the Olympus video colonoscope was advanced from the rectum o the cecum with difficulty because of a very tortuous colon.  A small, sessile mass was recognized at 80 cm.  It was biopsied for pathology.  The mass had a large base, and therefore a snare polypectomy was not attempted.  A few scattered diverticula throughout the colon, small internal hemorrhoids were seen on retroflexion.  There was some residual stool in the colon.  Very small  lesions ay have been missed.  The patient tolerated the procedure well without complications.  IMPRESSION: 1. Small, non-bleeding internal hemorrhoids. 2. Scattered diverticular disease. 3. Sessile mass at 80 cm. 4. Very tortuous colon.  Small residual debris in the colon.  Biopsies of the mass    at 80 cm done.  Results pending.  RECOMMENDATIONS: 1. Await pathology results. 2. CT scan of the abdomen and pelvis. 3. Check ______ levels today. 4. CBC with diff today. 5. Outpatient followup in the next week. 6. Surgical consultation by Dr. Sandria Bales. Newman (as per the familys request) as  soon as possible. DD:  05/25/99 TD:  05/26/99 Job: [id number]

## 2010-11-05 NOTE — Op Note (Signed)
[name], [name]             ACCOUNT NO.:  [id number]   MEDICAL RECORD NO.:  [id number]          PATIENT TYPE:  AMB   LOCATION:  ENDO                         FACILITY:  [facility]   PHYSICIAN:  Anselmo Rod, M.D.  DATE OF BIRTH:  08/17/1944   DATE OF PROCEDURE:  08/15/2005  DATE OF DISCHARGE:                                 OPERATIVE REPORT   PROCEDURE PERFORMED:  Colonoscopy with multiple cold biopsies.   ENDOSCOPIST:  Anselmo Rod, M.D.   INSTRUMENT USED:  Olympus video colonoscope.   INDICATION FOR PROCEDURE:  [age]-year-old African American female  undergoing colonoscopy for rectal bleeding -- the patient has a family  history of colon cancer in her father -- to rule out colonic polyps, masses,  etc.   PREPROCEDURE PREPARATION:  Informed consent was procured from the patient.  The patient was fasted for 4 hours prior to the procedure and prepped with  Osmoprep during the night of and in the morning of the procedure.  Risks and  benefits of the procedure including a 10% miss rate of cancer in polyps was  discussed with the patient as well.   PREPROCEDURE PHYSICAL:  VITAL SIGNS:  The patient had stable vital signs.  NECK:  Supple.  CHEST:  Clear to auscultation.  CARDIAC:  S1 and S2 regular.  ABDOMEN:  Soft with normal bowel sounds.   DESCRIPTION OF PROCEDURE:  The patient was placed in the left lateral  decubitus position and sedated with 50 mcg of fentanyl and 5 mg of Versed in  slow incremental doses.  Once the patient was adequately sedated and  maintained on low-flow oxygen and continuous cardiac monitoring, the Olympus  video colonoscope was advanced from the rectum to the cecum.  There was some  residual stool in the colon; multiple washings were done; the prep was  somewhat poor.  Inflammatory changes were noted in the rectum from 0-5 cm;  biopsies were done to rule out proctitis.  Two polyps were removed by cold  biopsy from 70 cm.  A few scattered  diverticula were noted.  Nodular changes  were noted over the ileocecal valve; this area was biopsied for pathology as  well.  The terminal ileum appeared normal.  The patient tolerated the  procedure well without complication.   IMPRESSION:  1.  Inflammatory changes noted in the rectum from 0-5 cm, biopsies done to      rule out proctitis.  2.  Two polyps biopsied from the rectum.  3.  Two polyps removed by cold biopsy from 70 cm.  4.  A few scattered diverticula.  5.  Nodularity of the mucosa noted over ileocecal valve, biopsies done,      results pending.  6.  Normal terminal ileum.   RECOMMENDATIONS:  1.  Await pathology results.  2.  Avoid all nonsteroidals for now.  3.  Outpatient followup in the next 2 weeks for further recommendations.      Anselmo Rod, M.D.  Electronically Signed     JNM/MEDQ  D:  08/16/2005  T:  08/17/2005  Job:  [id number]   cc:  [name], M.D.  Fax: [phone]

## 2010-11-29 ENCOUNTER — Telehealth: Payer: Self-pay | Admitting: Pulmonary Disease

## 2010-11-29 DIAGNOSIS — J438 Other emphysema: Secondary | ICD-10-CM

## 2010-11-29 MED ORDER — BUDESONIDE-FORMOTEROL FUMARATE 160-4.5 MCG/ACT IN AERO: 2.0000 | INHALATION_SPRAY | Freq: Two times a day (BID) | RESPIRATORY_TRACT | Status: DC

## 2010-11-29 NOTE — Telephone Encounter (Signed)
Spoke with pt and notified rx refill for symbicort was sent to pharmacy.

## 2010-12-29 ENCOUNTER — Other Ambulatory Visit: Payer: Self-pay | Admitting: Cardiothoracic Surgery

## 2010-12-29 DIAGNOSIS — R222 Localized swelling, mass and lump, trunk: Secondary | ICD-10-CM

## 2010-12-30 ENCOUNTER — Encounter: Payer: Self-pay | Admitting: Cardiothoracic Surgery

## 2011-01-06 ENCOUNTER — Encounter: Payer: Self-pay | Admitting: Cardiothoracic Surgery

## 2011-02-07 DIAGNOSIS — Z87448 Personal history of other diseases of urinary system: Secondary | ICD-10-CM

## 2011-02-07 DIAGNOSIS — R0602 Shortness of breath: Secondary | ICD-10-CM

## 2011-02-07 DIAGNOSIS — Z8669 Personal history of other diseases of the nervous system and sense organs: Secondary | ICD-10-CM

## 2011-02-07 DIAGNOSIS — Z8709 Personal history of other diseases of the respiratory system: Secondary | ICD-10-CM

## 2011-02-07 DIAGNOSIS — K635 Polyp of colon: Secondary | ICD-10-CM

## 2011-02-07 DIAGNOSIS — I10 Essential (primary) hypertension: Secondary | ICD-10-CM | POA: Insufficient documentation

## 2011-02-07 DIAGNOSIS — C189 Malignant neoplasm of colon, unspecified: Secondary | ICD-10-CM | POA: Insufficient documentation

## 2011-02-07 DIAGNOSIS — E785 Hyperlipidemia, unspecified: Secondary | ICD-10-CM

## 2011-02-07 DIAGNOSIS — K529 Noninfective gastroenteritis and colitis, unspecified: Secondary | ICD-10-CM

## 2011-02-07 DIAGNOSIS — C37 Malignant neoplasm of thymus: Secondary | ICD-10-CM

## 2011-02-09 ENCOUNTER — Other Ambulatory Visit: Payer: Self-pay | Admitting: Cardiothoracic Surgery

## 2011-02-09 DIAGNOSIS — R222 Localized swelling, mass and lump, trunk: Secondary | ICD-10-CM

## 2011-02-10 ENCOUNTER — Encounter: Payer: Self-pay | Admitting: Cardiothoracic Surgery

## 2011-03-08 ENCOUNTER — Other Ambulatory Visit: Payer: Self-pay | Admitting: Cardiothoracic Surgery

## 2011-03-08 ENCOUNTER — Ambulatory Visit: Payer: Medicaid Other | Admitting: Radiation Oncology

## 2011-03-09 DIAGNOSIS — C37 Malignant neoplasm of thymus: Secondary | ICD-10-CM

## 2011-03-10 ENCOUNTER — Encounter: Payer: Medicare Other | Admitting: Cardiothoracic Surgery

## 2011-03-30 ENCOUNTER — Other Ambulatory Visit: Payer: Self-pay | Admitting: Cardiothoracic Surgery

## 2011-03-30 DIAGNOSIS — R222 Localized swelling, mass and lump, trunk: Secondary | ICD-10-CM

## 2011-03-31 ENCOUNTER — Encounter: Payer: Self-pay | Admitting: Cardiothoracic Surgery

## 2011-03-31 ENCOUNTER — Ambulatory Visit
Admission: RE | Admit: 2011-03-31 | Discharge: 2011-03-31 | Disposition: A | Discharge status: Home / Self Care | Payer: Medicare Other | Source: Ambulatory Visit | Attending: Cardiothoracic Surgery | Admitting: Cardiothoracic Surgery

## 2011-03-31 ENCOUNTER — Ambulatory Visit (INDEPENDENT_AMBULATORY_CARE_PROVIDER_SITE_OTHER): Payer: Medicare Other | Admitting: Cardiothoracic Surgery

## 2011-03-31 VITALS — BP 107/65 | HR 82 | Resp 18 | Ht 66.0 in | Wt 116.0 lb

## 2011-03-31 DIAGNOSIS — R222 Localized swelling, mass and lump, trunk: Secondary | ICD-10-CM

## 2011-03-31 DIAGNOSIS — C37 Malignant neoplasm of thymus: Secondary | ICD-10-CM

## 2011-03-31 NOTE — Progress Notes (Signed)
[address]            [place] [address]          [phone]       [name] Date of Birth: [birth year]  Josph Macho, MD [name], MD  Chief Complaint:  Chief Complaint  Patient presents with  . Follow-up    6 month f/u with CXR, thymic carcinoma    History of Present Illness Patient returns to the office today in followup from her partial sternotomy and excision of mediastinal mass done 05/10/2010. 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 Following surgery she saw Dr Roselind Messier  fro radiation therapy to the mediastinum. Since that time she has had no evidence of recurrent mediastinal tumor. She does have significant medical problems. Her primary complaint today he was "ants crawling on her feet". She notes she sees Dr. Lucianne Muss for neuropathy.   Past Medical History  Diagnosis Date  . SOB (shortness of breath)   . Colitis   . Hyperlipidemia   . Colon cancer     s/p Left hemicolectomy  . Colon polyps   . HTN (hypertension)   . History of glaucoma   . History of COPD   . 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 History of renal insufficiency syndrome     Past Surgical History  Procedure Date  . Gallbladder surgery 2001    Dr Ovidio Kin  . Colon surgery 2001    Dr Ovidio Kin  . Partial sternotomy with excision of mediastinal mass 05/10/2010    Dr Tyrone Sage    History  Smoking status  . Former Smoker -- 0.8 packs/day for 47 years  . Quit date: 06/20/2008  Smokeless tobacco  . Not on file    History  Alcohol Use No    No Known Allergies  Current Outpatient Prescriptions  Medication Sig Dispense Refill  . albuterol (PROAIR HFA) 108 (90 BASE) MCG/ACT inhaler Inhale 2 puffs into the lungs every 6 (six) hours as needed.  1 Inhaler  5  . budesonide-formoterol (SYMBICORT) 160-4.5 MCG/ACT inhaler Inhale 2 puffs into the lungs 2 (two) times daily.  1 Inhaler  5  .  clonazePAM (KLONOPIN) 0.5 MG tablet Take 0.5 mg by mouth 2 (two) times daily as needed.        . megestrol (MEGACE) 40 MG/ML suspension Take 200 mg by mouth daily.        . Timolol Maleate 0.5 % (DAILY) SOLN Apply to eye. 1drop in each eye every 4 to 6 hours       . tiotropium (SPIRIVA) 18 MCG inhalation capsule Place 1 capsule (18 mcg total) into inhaler and inhale daily.  30 capsule  5  . verapamil (VERELAN PM) 120 MG 24 hr capsule Take 120 mg by mouth daily.           Family History  Problem Relation Age of Onset  . Diabetes Mother   . Arthritis Mother   . Heart disease Mother   . Colon cancer Father     Review of Systems: Review of Systems  Constitutional: Positive for unexpected weight change. Negative for fever.  HENT: Positive for congestion. Negative for ear pain, nosebleeds, rhinorrhea, sneezing, trouble swallowing, dental problem, postnasal drip and sinus pressure.  Eyes: Negative for redness and itching.  Respiratory: Positive for shortness of breath. Negative for cough, chest tightness and wheezing.  Cardiovascular: Negative for palpitations and leg swelling.  Gastrointestinal: Negative for nausea and vomiting.  Genitourinary: Negative for dysuria.  Musculoskeletal: Negative for joint swelling.  Skin: Negative for rash.  Neurological: Negative for headaches.  Hematological: Does not bruise/bleed easily.  Psychiatric/Behavioral: She does complain of significant anxiety which has been a chronic problem   Physical Exam:  BP 107/65  Pulse 82  Resp 18  Ht 5\' 6"  (1.676 m)  Wt 116 lb (52.617 kg)  BMI 18.72 kg/m2  SpO2 94%  On physical examination the patient is able to relate her history but is very poor on any details. She is in no distress at the time of exam Her lungs are clear bilaterally She has no cervical supraclavicular axillary or inguinal adenopathy The sternotomy partial incision is well-healed Abdominal exam reveals thin abdomen without palpable masses  neither the liver nor spleen appear enlarged She has no calf tenderness or edema   Diagnostic Studies & Laboratory data:  *RADIOLOGY REPORT*  Clinical Data: Surgery in January 2012 for thymic carcinoma, also history of carcinoma of the colon, follow-up  CHEST - 2 VIEW   Comparison: CT chest of 08/30/2010 and chest x-ray of 08/03/2010  Findings: The lungs remain clear and slightly hyperaerated. Mediastinal contours are stable. Median sternotomy sutures are noted from prior anterior mediastinal surgery. The heart is within normal limits in size. No acute bony abnormality is seen.  IMPRESSION: No active lung disease. No change in hyperaeration. Stable postoperative change.     Assessment / Plan:  Patient is now about one year post resection of thymic carcinoma followed by radiation therapy So far she's had no evidence of recurrence She had a CT scan done by medical oncology in the spring of 2012 [facility] plan to see her back in 3 months with a followup CT scan of the chest which will be about 1 year postop and post radiation therapy.   [name] B

## 2011-03-31 NOTE — Patient Instructions (Signed)
Return 3 months with ct scan of chest

## 2011-04-11 ENCOUNTER — Ambulatory Visit
Admission: RE | Admit: 2011-04-11 | Discharge: 2011-04-11 | Disposition: A | Discharge status: Home / Self Care | Payer: Medicare Other | Source: Ambulatory Visit | Attending: Radiation Oncology | Admitting: Radiation Oncology

## 2011-04-25 ENCOUNTER — Ambulatory Visit: Payer: Medicare Other | Admitting: Pulmonary Disease

## 2011-05-18 ENCOUNTER — Ambulatory Visit: Payer: Self-pay | Admitting: Pulmonary Disease

## 2011-05-29 ENCOUNTER — Other Ambulatory Visit: Payer: Self-pay | Admitting: Pulmonary Disease

## 2011-05-30 ENCOUNTER — Other Ambulatory Visit: Payer: Self-pay | Admitting: Cardiothoracic Surgery

## 2011-05-30 DIAGNOSIS — D150 Benign neoplasm of thymus: Secondary | ICD-10-CM

## 2011-06-07 ENCOUNTER — Ambulatory Visit (INDEPENDENT_AMBULATORY_CARE_PROVIDER_SITE_OTHER): Payer: Medicare Other | Admitting: Pulmonary Disease

## 2011-06-07 ENCOUNTER — Encounter: Payer: Self-pay | Admitting: Pulmonary Disease

## 2011-06-07 VITALS — BP 118/74 | HR 83 | Temp 98.2°F | Ht 66.0 in | Wt 122.0 lb

## 2011-06-07 DIAGNOSIS — J438 Other emphysema: Secondary | ICD-10-CM

## 2011-06-07 MED ORDER — TIOTROPIUM BROMIDE MONOHYDRATE 18 MCG IN CAPS: 18.0000 ug | ORAL_CAPSULE | Freq: Every day | RESPIRATORY_TRACT | Status: DC

## 2011-06-07 MED ORDER — BUDESONIDE-FORMOTEROL FUMARATE 160-4.5 MCG/ACT IN AERO: 2.0000 | INHALATION_SPRAY | Freq: Two times a day (BID) | RESPIRATORY_TRACT | Status: DC

## 2011-06-07 MED ORDER — ALBUTEROL SULFATE HFA 108 (90 BASE) MCG/ACT IN AERS: 2.0000 | INHALATION_SPRAY | Freq: Four times a day (QID) | RESPIRATORY_TRACT | Status: DC | PRN

## 2011-06-07 NOTE — Assessment & Plan Note (Signed)
The patient has known severe emphysema, but has been doing very well on her current bronchodilator regimen.  She has had no recent exacerbations, or pulmonary infections.  I have asked her to continue on her medications, and to followup with me in 6 months.  I have also asked her to work aggressively on some type of conditioning program.

## 2011-06-07 NOTE — Patient Instructions (Signed)
No change in medications.  Will send in refills for the meds. Stay as active as possible. followup with me in 6mos.

## 2011-06-07 NOTE — Progress Notes (Signed)
  Subjective:    Patient ID: [name], female    DOB: Jan 09, 1941, 70 y.o.   MRN: [id number]  HPI The patient comes in today for follow up of her known severe emphysema.  She's been staying on her bronchodilator regimen, and has not had an issue with a COPD flare or infection.  She feels that her breathing is at baseline, and denies any significant cough.   Review of Systems  Constitutional: Negative for fever and unexpected weight change.  HENT: Negative for ear pain, nosebleeds, congestion, sore throat, rhinorrhea, sneezing, trouble swallowing, dental problem, postnasal drip and sinus pressure.   Eyes: Negative for redness and itching.  Respiratory: Positive for shortness of breath. Negative for cough, chest tightness and wheezing.   Cardiovascular: Negative for palpitations and leg swelling.  Gastrointestinal: Negative for nausea and vomiting.  Genitourinary: Negative for dysuria.  Musculoskeletal: Negative for joint swelling.  Skin: Negative for rash.  Neurological: Negative for headaches.  Hematological: Does not bruise/bleed easily.  Psychiatric/Behavioral: Negative for dysphoric mood. The patient is not nervous/anxious.        Objective:   Physical Exam Thin female in no acute distress Nose without purulence or discharge noted Chest decreased breath sounds, but no wheezes or rhonchi Cardiac exam with regular rate and rhythm Lower extremities without edema, no cyanosis noted Alert and oriented, moves all 4 extremities.       Assessment & Plan:

## 2011-07-07 ENCOUNTER — Ambulatory Visit: Payer: Medicare Other | Admitting: Cardiothoracic Surgery

## 2011-07-07 ENCOUNTER — Other Ambulatory Visit: Payer: Medicare Other

## 2011-07-28 ENCOUNTER — Ambulatory Visit
Admission: RE | Admit: 2011-07-28 | Discharge: 2011-07-28 | Disposition: A | Discharge status: Home / Self Care | Payer: PRIVATE HEALTH INSURANCE | Source: Ambulatory Visit | Attending: Cardiothoracic Surgery | Admitting: Cardiothoracic Surgery

## 2011-07-28 ENCOUNTER — Encounter: Payer: Self-pay | Admitting: Cardiothoracic Surgery

## 2011-07-28 ENCOUNTER — Ambulatory Visit (INDEPENDENT_AMBULATORY_CARE_PROVIDER_SITE_OTHER): Payer: Medicaid Other | Admitting: Cardiothoracic Surgery

## 2011-07-28 VITALS — BP 124/73 | HR 96 | Ht 66.0 in | Wt 124.0 lb

## 2011-07-28 DIAGNOSIS — D150 Benign neoplasm of thymus: Secondary | ICD-10-CM

## 2011-07-28 DIAGNOSIS — C37 Malignant neoplasm of thymus: Secondary | ICD-10-CM

## 2011-07-28 NOTE — Progress Notes (Signed)
[address]            [place] [address]          [phone]       IVIONA HOLE [facility] Medical Record #[id number] Date of Birth: 1940/08/22  [name], MD [name], MD, MD  Chief Complaint:   PostOp Follow Up Visit Excision of Thymic Mass 05/10/2010, 5.4 cm Thymic Carcinoma Radiation Therapy following Surgery  History of Present Illness:     Since last seen she's had no changes in her health status. She still is somewhat limited by COPD and exertional shortness of breath. She notes that she has gained some weight.     History  Smoking status  . Former Smoker -- 0.8 packs/day for 47 years  . Types: Cigarettes  . Quit date: 06/20/2008  Smokeless tobacco  . Not on file       No Known Allergies  Current Outpatient Prescriptions  Medication Sig Dispense Refill  . albuterol (PROAIR HFA) 108 (90 BASE) MCG/ACT inhaler Inhale 2 puffs into the lungs every 6 (six) hours as needed.  1 Inhaler  5  . budesonide-formoterol (SYMBICORT) 160-4.5 MCG/ACT inhaler Inhale 2 puffs into the lungs 2 (two) times daily.  1 Inhaler  5  . clonazePAM (KLONOPIN) 0.5 MG tablet Take 0.5 mg by mouth 2 (two) times daily as needed.        . Timolol Maleate 0.5 % (DAILY) SOLN Apply to eye. 1drop in each eye every 4 to 6 hours       . tiotropium (SPIRIVA HANDIHALER) 18 MCG inhalation capsule Place 1 capsule (18 mcg total) into inhaler and inhale daily.  30 capsule  5  . verapamil (VERELAN PM) 120 MG 24 hr capsule Take 120 mg by mouth daily.        Marland Kitchen zolpidem (AMBIEN) 10 MG tablet Take 10 mg by mouth at bedtime as needed.           Physical Exam: BP 124/73  Pulse 96  Ht 5\' 6"  (1.676 m)  Wt 124 lb (56.246 kg)  BMI 20.01 kg/m2  SpO2 93%  General appearance: alert, cooperative, appears older than stated age and cachectic Neurologic: intact Heart: regular rate and rhythm, S1, S2 normal, no murmur, click, rub or gallop Lungs: clear to  auscultation bilaterally Abdomen: soft, non-tender; bowel sounds normal; no masses,  no organomegaly Wound: The sternal incision is stable and well healed I do not appreciate any cervical or supraclavicular adenopathy  Diagnostic Studies & Laboratory data:         Recent Radiology Findings: Ct Chest Wo Contrast  07/28/2011  *RADIOLOGY REPORT*  Clinical Data: History of surgery for carcinoma of the thymus and 2011 with radiation as well, also history of colon carcinoma  CT CHEST WITHOUT CONTRAST  Technique:  Multidetector CT imaging of the chest was performed following the standard protocol without IV contrast.  Comparison: CT chest of 08/30/2010  Findings: On the lung window images diffuse changes of centrilobular emphysema are again noted.  A small vague nodular opacity is present in the inferior right upper lobe and is stable. No new lung nodule is seen and no enlarging nodule is noted.  No pleural effusion is seen.  On soft tissue window images, the thyroid gland is unremarkable with a small left thyroid nodule noted. A surgical clip is present in the anterior mediastinum  and no recurrent soft tissue is seen. No mediastinal or hilar adenopathy is seen on this unenhanced study. Median sternotomy sutures are noted.  No abnormality of the upper abdomen is seen.  No bony abnormality is noted.  IMPRESSION:  1.  Stable CT of the chest.  No evidence of recurrence of thymic carcinoma.  No metastatic disease. 2.  Diffuse centrilobular emphysema. 3.  The left thyroid nodule of approximately 10 mm in diameter.  Original Report Authenticated By: Juline Patch, M.D.      Recent Labs: Lab Results  Component Value Date   WBC 10.5* 08/27/2010   HGB 10.2* 08/27/2010   HCT 31.2* 08/27/2010   PLT 441* 08/27/2010   GLUCOSE 95 08/27/2010   CHOL  Value: 173        ATP III CLASSIFICATION:  <200     mg/dL   Desirable  829-562  mg/dL   Borderline High  >=130    mg/dL   High        86/57/8469   TRIG 251* 04/30/2010   HDL 26*  04/30/2010   LDLCALC  Value: 97        Total Cholesterol/HDL:CHD Risk Coronary Heart Disease Risk Table                     Men   Women  1/2 Average Risk   3.4   3.3  Average Risk       5.0   4.4  2 X Average Risk   9.6   7.1  3 X Average Risk  23.4   11.0        Use the calculated Patient Ratio above and the CHD Risk Table to determine the patient's CHD Risk.        ATP III CLASSIFICATION (LDL):  <100     mg/dL   Optimal  629-528  mg/dL   Near or Above                    Optimal  130-159  mg/dL   Borderline  413-244  mg/dL   High  >010     mg/dL   Very High 27/25/3664   ALT 11 07/20/2010   AST 26 07/20/2010   NA 138 08/27/2010   K 3.3* 08/27/2010   CL 104 08/27/2010   CREATININE 2.35* 08/27/2010   BUN 15 08/27/2010   CO2 21 08/27/2010   TSH 3.333 07/23/2010   INR 1.28 05/09/2010      Assessment / Plan:     Patient is just over a year post surgical resection of thymic carcinoma with followup radiation therapy. She returns today for followup CT scan of the chest that shows no evidence of recurrence. I plan to see her back in one year with a followup CT scan.   Delight Ovens MD 07/28/2011 11:10 AM

## 2011-07-28 NOTE — Patient Instructions (Signed)
CT scan shows no recurrence of Thymic Carcinomia Return One year for repeat scan

## 2011-08-15 ENCOUNTER — Other Ambulatory Visit: Payer: Self-pay | Admitting: Dermatology

## 2011-08-28 ENCOUNTER — Encounter (HOSPITAL_COMMUNITY): Payer: Self-pay | Admitting: Emergency Medicine

## 2011-08-28 ENCOUNTER — Emergency Department (HOSPITAL_COMMUNITY)
Admission: EM | Admit: 2011-08-28 | Discharge: 2011-08-28 | Disposition: A | Discharge status: Home / Self Care | Payer: PRIVATE HEALTH INSURANCE | Attending: Emergency Medicine | Admitting: Emergency Medicine

## 2011-08-28 DIAGNOSIS — Z87891 Personal history of nicotine dependence: Secondary | ICD-10-CM | POA: Insufficient documentation

## 2011-08-28 DIAGNOSIS — Z85038 Personal history of other malignant neoplasm of large intestine: Secondary | ICD-10-CM | POA: Insufficient documentation

## 2011-08-28 DIAGNOSIS — I10 Essential (primary) hypertension: Secondary | ICD-10-CM | POA: Insufficient documentation

## 2011-08-28 DIAGNOSIS — J449 Chronic obstructive pulmonary disease, unspecified: Secondary | ICD-10-CM | POA: Insufficient documentation

## 2011-08-28 DIAGNOSIS — B029 Zoster without complications: Secondary | ICD-10-CM | POA: Insufficient documentation

## 2011-08-28 DIAGNOSIS — J4489 Other specified chronic obstructive pulmonary disease: Secondary | ICD-10-CM | POA: Insufficient documentation

## 2011-08-28 DIAGNOSIS — E785 Hyperlipidemia, unspecified: Secondary | ICD-10-CM | POA: Insufficient documentation

## 2011-08-28 MED ORDER — VALACYCLOVIR HCL 1 G PO TABS: 1000.0000 mg | ORAL_TABLET | Freq: Three times a day (TID) | ORAL | Status: AC

## 2011-08-28 MED ORDER — HYDROCODONE-ACETAMINOPHEN 5-325 MG PO TABS: 1.0000 | ORAL_TABLET | Freq: Three times a day (TID) | ORAL | Status: AC | PRN

## 2011-08-28 NOTE — ED Provider Notes (Signed)
History     CSN: [id number]  Arrival date & time 08/28/11  1450   First MD Initiated Contact with Patient 08/28/11 1553      Chief Complaint  Patient presents with  . Herpes Zoster    HPI The patient presents with rash and pain.  The patient's rash began 10 days ago.  Since onset has become more impressive, as the rash has become more pronounced.  The rash is shingles.  She has seen her primary care physician and had this diagnosis.  The pain is a burning, minimally,  improved with OTC medications.  Today the patient is in the emergency department with a colleague who is being evaluated for a separate complaint.  The patient notes that her pain has become more pronounced.  She denies any fevers, chills, dyspnea, other chest pain, or other focal complaints. Past Medical History  Diagnosis Date  . SOB (shortness of breath)   . Colitis   . Hyperlipidemia   . Colon polyps   . HTN (hypertension)   . History of glaucoma   . History of COPD   . History of renal insufficiency syndrome   . Colon cancer     s/p Left hemicolectomy  . 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 Gallbladder surgery 2001    Dr Ovidio Kin  . Colon surgery 2001    Dr Ovidio Kin  . Partial sternotomy with excision of mediastinal mass 05/10/2010    Dr Tyrone Sage    Family History  Problem Relation Age of Onset  . Diabetes Mother   . Arthritis Mother   . Heart disease Mother   . Colon cancer Father     History  Substance Use Topics  . Smoking status: Former Smoker -- 0.8 packs/day for 47 years    Types: Cigarettes    Quit date: 06/20/2008  . Smokeless tobacco: Not on file  . Alcohol Use: No    OB History    Grav Para Term Preterm Abortions TAB SAB Ect Mult Living                  Review of Systems  All other systems reviewed and are negative.    Allergies  Review of patient's allergies indicates no known allergies.  Home Medications   Current  Outpatient Rx  Name Route Sig Dispense Refill  . ALBUTEROL SULFATE HFA 108 (90 BASE) MCG/ACT IN AERS Inhalation Inhale 2 puffs into the lungs every 6 (six) hours as needed. 1 Inhaler 5  . BUDESONIDE-FORMOTEROL FUMARATE 160-4.5 MCG/ACT IN AERO Inhalation Inhale 2 puffs into the lungs 2 (two) times daily. 1 Inhaler 5  . CLONAZEPAM 0.5 MG PO TABS Oral Take 0.5 mg by mouth 2 (two) times daily as needed.      Marland Kitchen TIMOLOL MALEATE 0.5 % (DAILY) OP SOLN Ophthalmic Apply to eye. 1drop in each eye every 4 to 6 hours     . TIOTROPIUM BROMIDE MONOHYDRATE 18 MCG IN CAPS Inhalation Place 1 capsule (18 mcg total) into inhaler and inhale daily. 30 capsule 5  . VERAPAMIL HCL 120 MG PO CP24 Oral Take 120 mg by mouth daily.      Marland Kitchen ZOLPIDEM TARTRATE 10 MG PO TABS Oral Take 10 mg by mouth at bedtime as needed.      BP 121/72  Pulse 105  Temp(Src) 98.6 F (37 C) (Oral)  Resp 20  SpO2 93%  Physical Exam  Nursing  note and vitals reviewed. Constitutional: She is oriented to person, place, and time. She appears well-developed and well-nourished. No distress.  HENT:  Head: Normocephalic and atraumatic.  Eyes: Conjunctivae and EOM are normal.  Cardiovascular: Normal rate and regular rhythm.   Pulmonary/Chest: Effort normal and breath sounds normal. No stridor. No respiratory distress.  Abdominal: She exhibits no distension.  Musculoskeletal: She exhibits no edema.  Neurological: She is alert and oriented to person, place, and time. No cranial nerve deficit.  Skin: Skin is warm and dry.     Psychiatric: She has a normal mood and affect.    ED Course  Procedures (including critical care time)  Labs Reviewed - No data to display No results found.   No diagnosis found.    MDM  This elderly female who was recently diagnosed with shingles now presents with ongoing pain.  The patient is not taking antivirals.  She'll be prescribed antiviral to go with additional analgesics.  She was discharged in stable  condition.    Gerhard Munch, MD 08/28/11 [phone]

## 2011-08-28 NOTE — ED Notes (Signed)
Pt reports shingles on chest and back onset Thursday.

## 2011-10-13 ENCOUNTER — Other Ambulatory Visit: Payer: Self-pay | Admitting: Pulmonary Disease

## 2011-12-06 ENCOUNTER — Encounter: Payer: Self-pay | Admitting: Pulmonary Disease

## 2011-12-06 ENCOUNTER — Ambulatory Visit (INDEPENDENT_AMBULATORY_CARE_PROVIDER_SITE_OTHER): Payer: Medicare Other | Admitting: Pulmonary Disease

## 2011-12-06 VITALS — BP 112/68 | HR 83 | Temp 98.7°F | Ht 66.0 in | Wt 110.6 lb

## 2011-12-06 DIAGNOSIS — J438 Other emphysema: Secondary | ICD-10-CM

## 2011-12-06 DIAGNOSIS — B029 Zoster without complications: Secondary | ICD-10-CM | POA: Insufficient documentation

## 2011-12-06 MED ORDER — TIOTROPIUM BROMIDE MONOHYDRATE 18 MCG IN CAPS: 18.0000 ug | ORAL_CAPSULE | Freq: Every day | RESPIRATORY_TRACT | Status: DC

## 2011-12-06 MED ORDER — ALBUTEROL SULFATE HFA 108 (90 BASE) MCG/ACT IN AERS: 2.0000 | INHALATION_SPRAY | Freq: Four times a day (QID) | RESPIRATORY_TRACT | Status: DC | PRN

## 2011-12-06 MED ORDER — BUDESONIDE-FORMOTEROL FUMARATE 160-4.5 MCG/ACT IN AERO: 2.0000 | INHALATION_SPRAY | Freq: Two times a day (BID) | RESPIRATORY_TRACT | Status: DC

## 2011-12-06 NOTE — Assessment & Plan Note (Signed)
The patient is stable on her current medical regimen.  I have asked her to continue on this, to try and get some of exercise for conditioning, and to try and intake more calories for weight gain.  If she is doing well, I will see her back in 6 months.

## 2011-12-06 NOTE — Addendum Note (Signed)
Addended by: Fenton Foy on: 12/06/2011 02:41 PM   Modules accepted: Orders, Medications

## 2011-12-06 NOTE — Patient Instructions (Addendum)
No change in breathing medications.  Will call in new prescriptions for you. Work on some type of exercise, make sure you eat plenty of calories to promote weight gain If doing well, followup with me in 6mos.

## 2011-12-06 NOTE — Progress Notes (Signed)
  Subjective:    Patient ID: [name], female    DOB: [birth year], 71 y.o.   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  She has been maintaining her usual baseline, and has not had an acute exacerbation or pulmonary infection.  She is doing well on her bronchodilators, and has not had any side effects from the medications.   Review of Systems  Constitutional: Negative.  Negative for fever and unexpected weight change.  HENT: Negative.  Negative for ear pain, nosebleeds, congestion, sore throat, rhinorrhea, sneezing, trouble swallowing, dental problem, postnasal drip and sinus pressure.   Eyes: Negative.  Negative for redness and itching.  Respiratory: Negative.  Negative for cough, chest tightness, shortness of breath and wheezing.   Cardiovascular: Negative.  Negative for palpitations and leg swelling.  Gastrointestinal: Negative.  Negative for nausea and vomiting.  Genitourinary: Negative.  Negative for dysuria.  Musculoskeletal: Negative.  Negative for joint swelling.  Skin: Negative for rash.       Recent shingles   Neurological: Negative.  Negative for headaches.  Hematological: Negative.  Does not bruise/bleed easily.  Psychiatric/Behavioral: Negative.  Negative for dysphoric mood. The patient is not nervous/anxious.        Objective:   Physical Exam Thin female in no acute distress Nose without purulence or discharge noted Chest with decreased breath sounds throughout, no wheezing Cardiac exam with regular rate and rhythm Lower extremities without edema, no cyanosis Alert and oriented, moves all 4 extremities.       Assessment & Plan:

## 2012-04-05 IMAGING — CT CT CHEST W/O CM
1 of 2 series · 14 of 32 positions shown, 19 images · IV contrast (APPLIED)
Comparison: Chest radiographs dated 04/29/2010 and abdomen and
pelvis CT report dated 07/08/1999.

CT CHEST

CLINICAL DATA: Possible retrosternal mass on recent chest
radiographs.  Abdominal pain following defecation.  Nausea.  Weight
loss.  History of colon cancer resection and cholecystectomy.
Renal insufficiency.

CT CHEST, ABDOMEN AND PELVIS WITHOUT CONTRAST
TECHNIQUE: Multidetector CT imaging of the chest, abdomen and
pelvis was performed following the standard protocol without IV
contrast.

[Series 2: c/a/p 5.0 b31f · axial · 0.71mm/px · z∈[+526,+1146]mm · 14 of 138 slices shown, 19 images]
[im 7/138  soft-tissue]
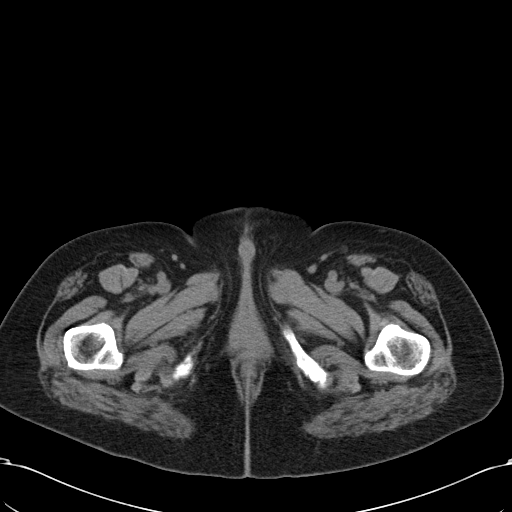
[im 7/138  bone]
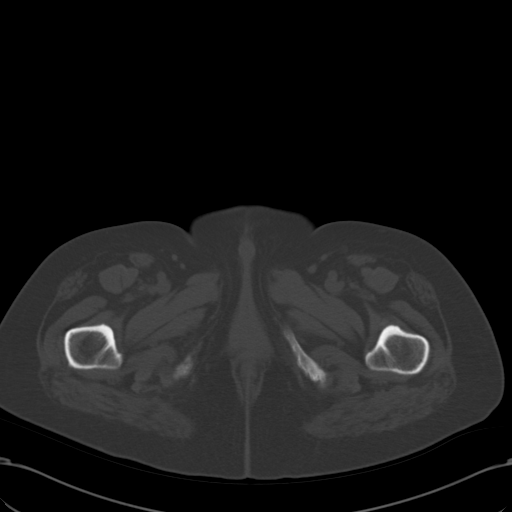
[im 21/138  soft-tissue]
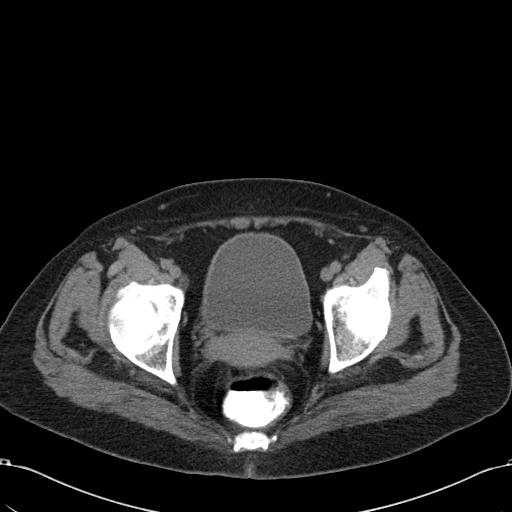
[im 28/138  soft-tissue]
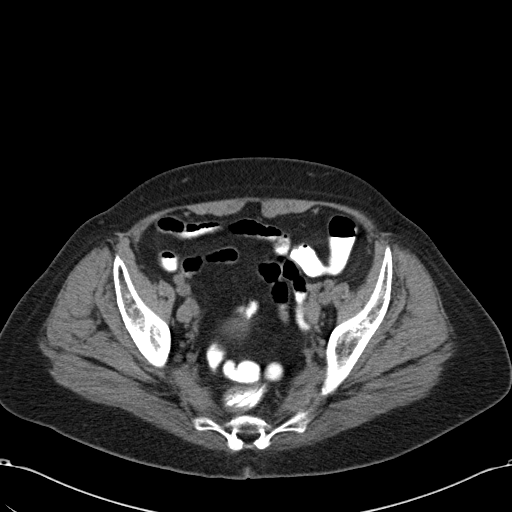
[im 42/138  soft-tissue]
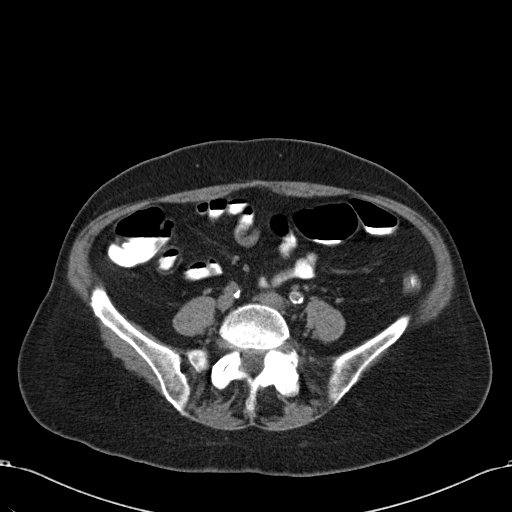
[im 48/138  soft-tissue]
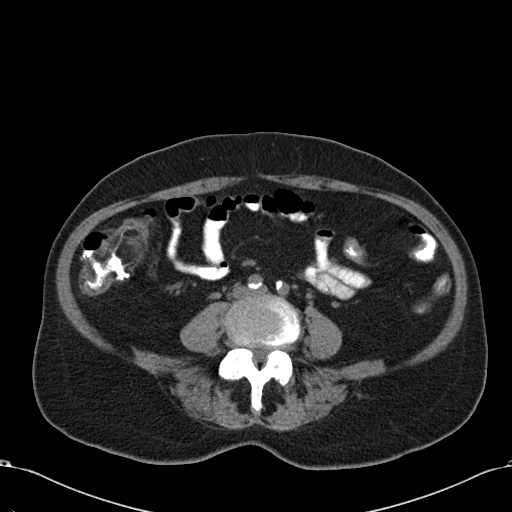
[im 62/138  soft-tissue]
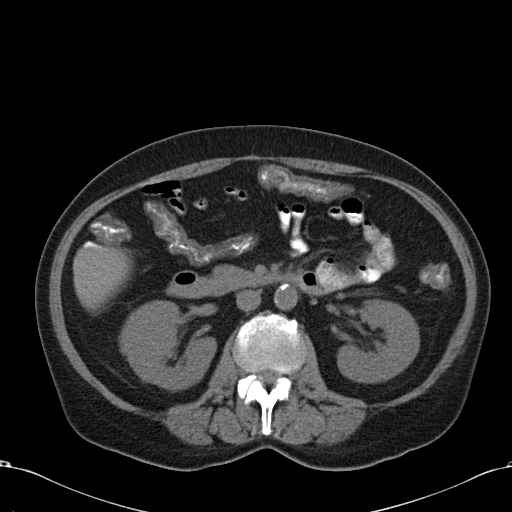
[im 69/138  soft-tissue]
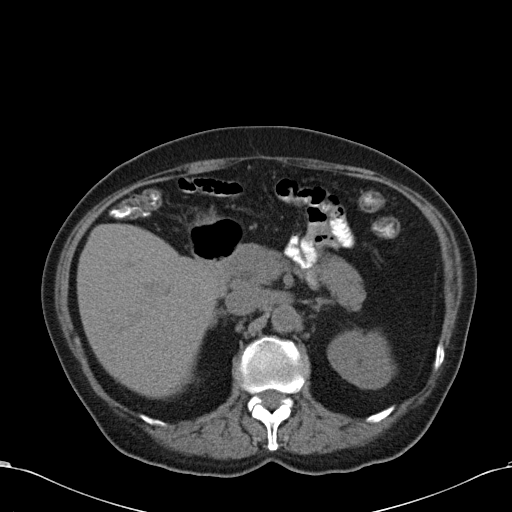
[im 76/138  soft-tissue]
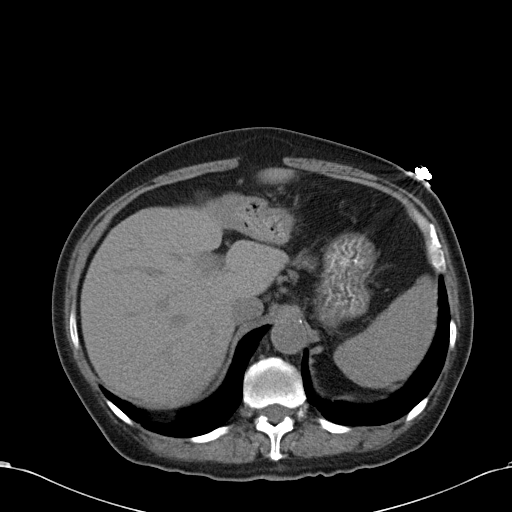
[im 90/138  soft-tissue]
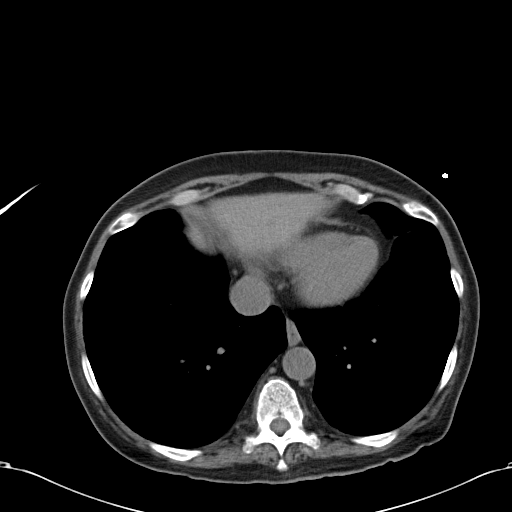
[im 90/138  bone]
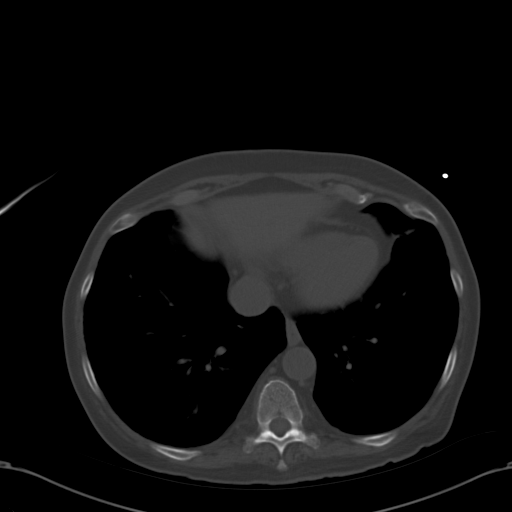
[im 96/138  soft-tissue]
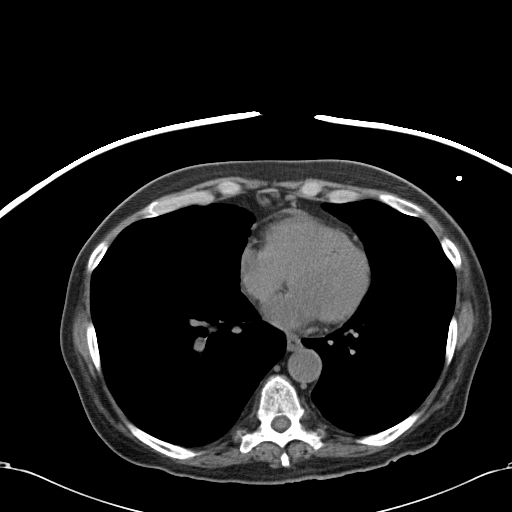
[im 110/138  soft-tissue]
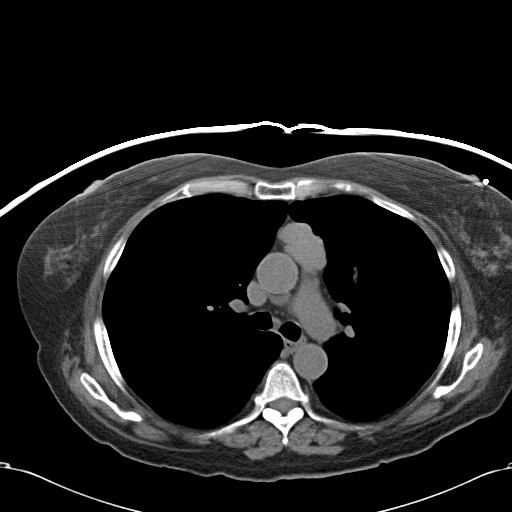
[im 110/138  lung]
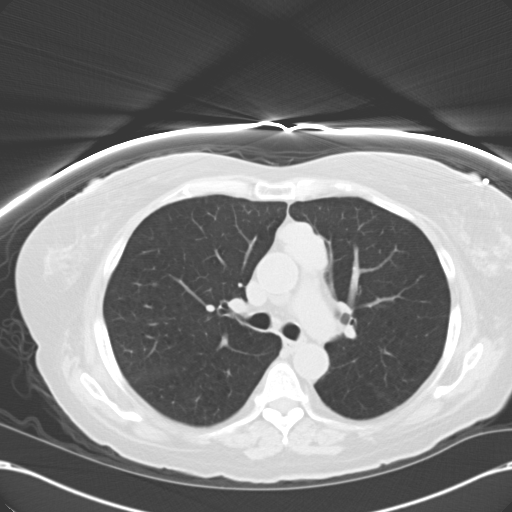
[im 117/138  soft-tissue]
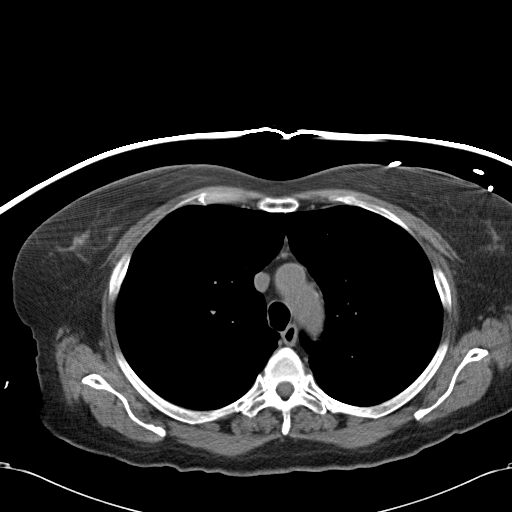
[im 117/138  lung]
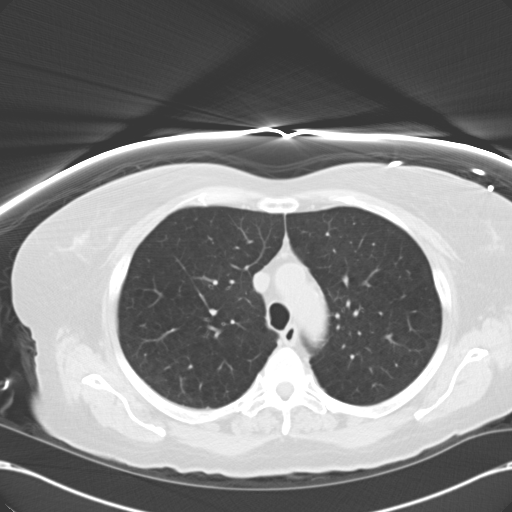
[im 124/138  lung]
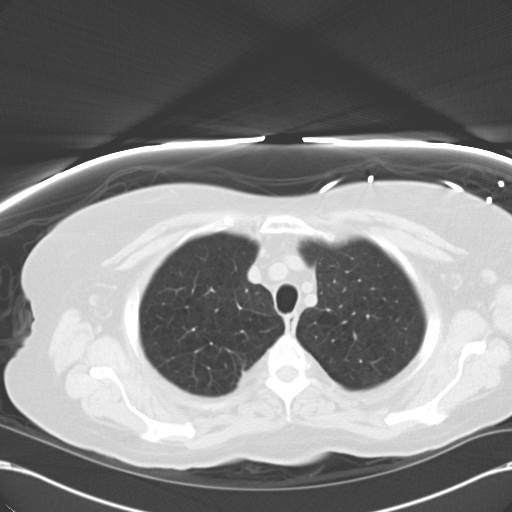
[im 131/138  soft-tissue]
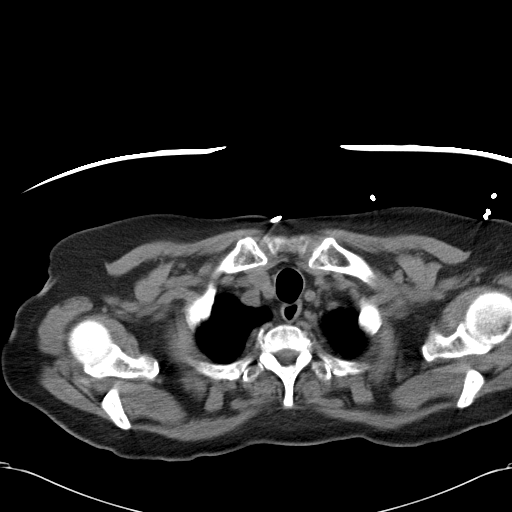
[im 131/138  lung]
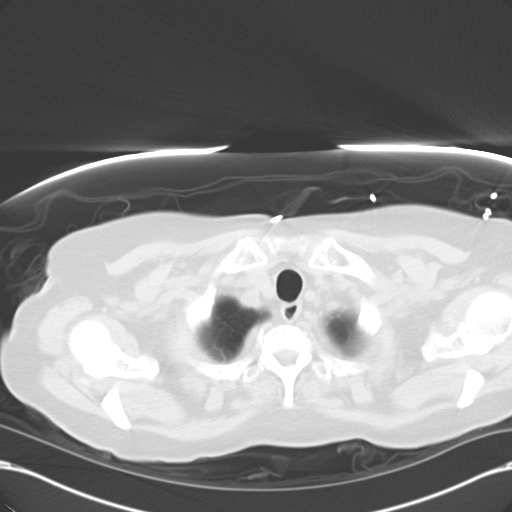

[14 of 32 positions shown; findings below may reference images not displayed]

FINDINGS: Multiple bilateral thyroid nodules.  The largest is
located anteriorly on the left, measuring 1.5 cm in maximum
diameter.  There is an anterior mediastinal mass, measuring 4.4 x
2.6 cm in maximum dimensions.  This has smoothly marginated,
macrolobulated contours.  No enlarged lymph nodes seen separate
from this mass.  This is located at the level of the aortic arch.
No lung masses seen.  The lungs are hyperexpanded with mild bullous
changes and mild diffuse peribronchial thickening.  Small amount of
linear atelectasis or scarring in the lingula.  Mild thoracic spine
degenerative changes.
IMPRESSION: 1.  4.4 cm anterior mediastinal mass.  This does not have the
appearance of a normal thymus.  Differential considerations include
lymphoma and thymoma.  This does not have features suspicious for a
teratoma.
2.  Changes of COPD and chronic bronchitis.
3.  Multinodular thyroid.  Elective thyroid ultrasound is
recommended.

CT ABDOMEN AND PELVIS
FINDINGS: Cholecystectomy clips.  Atheromatous arterial
calcifications.  Mild right colon diverticulosis.  Unremarkable
liver, spleen, pancreas, adrenal glands, kidneys, urinary bladder,
uterus and ovaries.  No enlarged lymph nodes.  Lumbar spine
degenerative changes.
IMPRESSION: No acute abdominal or pelvic abnormality.

## 2012-06-06 ENCOUNTER — Ambulatory Visit: Payer: Medicare Other | Admitting: Pulmonary Disease

## 2012-06-25 ENCOUNTER — Other Ambulatory Visit: Payer: Self-pay | Admitting: Cardiothoracic Surgery

## 2012-06-25 DIAGNOSIS — C37 Malignant neoplasm of thymus: Secondary | ICD-10-CM

## 2012-07-26 ENCOUNTER — Ambulatory Visit
Admission: RE | Admit: 2012-07-26 | Discharge: 2012-07-26 | Disposition: A | Discharge status: Home / Self Care | Payer: Medicare Other | Source: Ambulatory Visit | Attending: Cardiothoracic Surgery | Admitting: Cardiothoracic Surgery

## 2012-07-26 ENCOUNTER — Ambulatory Visit (INDEPENDENT_AMBULATORY_CARE_PROVIDER_SITE_OTHER): Payer: Medicare Other | Admitting: Cardiothoracic Surgery

## 2012-07-26 ENCOUNTER — Other Ambulatory Visit: Payer: Self-pay

## 2012-07-26 ENCOUNTER — Encounter: Payer: Self-pay | Admitting: Cardiothoracic Surgery

## 2012-07-26 VITALS — BP 94/60 | HR 76 | Resp 20 | Ht 66.0 in | Wt 110.0 lb

## 2012-07-26 DIAGNOSIS — D381 Neoplasm of uncertain behavior of trachea, bronchus and lung: Secondary | ICD-10-CM

## 2012-07-26 DIAGNOSIS — Z09 Encounter for follow-up examination after completed treatment for conditions other than malignant neoplasm: Secondary | ICD-10-CM

## 2012-07-26 DIAGNOSIS — C37 Malignant neoplasm of thymus: Secondary | ICD-10-CM

## 2012-07-26 NOTE — Patient Instructions (Signed)
Will get PET scan at [facility] to evaluate slightly enlarged lymph node

## 2012-07-26 NOTE — Progress Notes (Signed)
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 She continues to be somewhat limited with her COPD and symptoms of shortness of breath but remains functional caring for her mother and is not on home oxygen.   History  Smoking status  . Former Smoker -- 0.8 packs/day for 47 years  . Types: Cigarettes  . Quit date: 06/20/2008  Smokeless tobacco  . Not on file       No Known Allergies  Current Outpatient Prescriptions  Medication Sig Dispense Refill  . albuterol (PROAIR HFA) 108 (90 BASE) MCG/ACT inhaler Inhale 2 puffs into the lungs every 6 (six) hours as needed.  1 Inhaler  6  . budesonide-formoterol (SYMBICORT) 160-4.5 MCG/ACT inhaler Inhale 2 puffs into the lungs 2 (two) times daily.  1 Inhaler  6  . clonazePAM (KLONOPIN) 0.5 MG tablet Take 0.5 mg by mouth 2 (two) times daily as needed.        . gabapentin (NEURONTIN) 300 MG capsule Take 300 mg by mouth 3 (three) times daily.       . Timolol Maleate 0.5 % (DAILY) SOLN Apply to eye. 1drop in each eye twice daily      . tiotropium (SPIRIVA HANDIHALER) 18 MCG inhalation capsule Place 1 capsule (18 mcg total) into inhaler and inhale daily.  30 capsule  6  . traMADol (ULTRAM) 50 MG tablet Take 50 mg by mouth every 6 (six) hours as needed.       . zolpidem (AMBIEN) 10 MG tablet Take 10 mg by mouth at bedtime as needed.           Physical Exam: BP  94/60  Pulse 76  Resp 20  Ht 5\' 6"  (1.676 m)  Wt 110 lb (49.896 kg)  BMI 17.75 kg/m2  SpO2 98%  General appearance: alert, cooperative, appears older than stated age and cachectic Neurologic: intact Heart: regular rate and rhythm, S1, S2 normal, no murmur, click, rub or gallop Lungs: clear to auscultation bilaterally Abdomen: soft, non-tender; bowel sounds normal; no masses,  no organomegaly Wound: The sternal incision is stable and well healed I do not appreciate any cervical or supraclavicular adenopathy Pigment changes around the right anterior chest and posterior chest which the patient notes developed after the shingles.  Diagnostic Studies & Laboratory data:         Recent Radiology Findings: Ct Chest Wo Contrast  07/26/2012  *RADIOLOGY REPORT*  Clinical Data: History of thymic carcinoma with resection, evaluate for recurrence, former smoking history  CT CHEST WITHOUT CONTRAST  Technique:  Multidetector CT imaging of the chest was performed following the standard protocol without IV contrast.  Comparison: CT  chest of 07/28/2011  Findings: On soft tissue window images, there is new soft tissue within the anterior mediastinum, anterior and just to the left of the ascending aorta.  This does not appear to be typical of scarring, and this soft tissue is worrisome for recurrence of thymic carcinoma.  No mediastinal or hilar adenopathy is seen. There is calcification in the region of the left anterior descending coronary artery.  No abnormality of the upper abdomen is seen.  A left lower thyroid nodule again is noted.  On the lung window images, emphysematous changes are present consistent with centrilobular emphysema. There is some strandiness within the paramediastinal lung primarily to the left midline anteriorly.  Some of this strandiness could be due to radiation fibrosis in the proper clinical setting.  However, there is a new nodular lesion within the left lower lobe superior segment of 9  mm in diameter. This will be unusual for metastatic lesion and could represent a developing primary lung carcinoma versus an atypical metastatic focus.   In addition a 3 mm nodular opacity is present in the right lower lobe which appears new as well and may represent a metastatic lesion.  No pleural effusion is seen.  Median sternotomy sutures are noted.  The central airway is patent.  IMPRESSION:  1.  New soft tissue within the anterior mediastinum worrisome for recurrence of thymic carcinoma. PET-CT may be helpful for further assessment if warranted clinically. 2.  Nodular lesion in the left lower lobe with a tiny nodule in the right lower lobe, suspicious for possible metastatic lesions. 3.  Linear markings paramediastinally and anteriorly may represent radiation fibrosis in the proper clinical setting but clinical correlation is recommended. 4.  Centrilobular emphysema. 5.  Stable low attenuation nodule in the left lobe of thyroid.   Original Report Authenticated By: Dwyane Dee, M.D.       Recent Labs: Lab Results  Component Value Date   WBC 10.5* 08/27/2010   HGB 10.2* 08/27/2010   HCT 31.2* 08/27/2010   PLT 441* 08/27/2010   GLUCOSE 95 08/27/2010   CHOL  Value: 173        ATP III CLASSIFICATION:  <200     mg/dL   Desirable  161-096  mg/dL   Borderline High  >=045    mg/dL   High        40/98/1191   TRIG 251* 04/30/2010   HDL 26* 04/30/2010   LDLCALC  Value: 97        Total Cholesterol/HDL:CHD Risk Coronary Heart Disease Risk Table                     Men   Women  1/2 Average Risk   3.4   3.3  Average Risk       5.0   4.4  2 X Average Risk   9.6   7.1  3 X Average Risk  23.4   11.0        Use the calculated Patient Ratio above and the CHD Risk Table to determine the patient's CHD Risk.        ATP III CLASSIFICATION (LDL):  <100     mg/dL   Optimal  478-295  mg/dL   Near or Above                    Optimal  130-159  mg/dL   Borderline  621-308  mg/dL   High  >657     mg/dL  Very High 04/30/2010   ALT 11  07/20/2010   AST 26 07/20/2010   NA 138 08/27/2010   K 3.3* 08/27/2010   CL 104 08/27/2010   CREATININE 2.35* 08/27/2010   BUN 15 08/27/2010   CO2 21 08/27/2010   TSH 3.333 07/23/2010   INR 1.28 05/09/2010      Assessment / Plan:    New soft tissue within the anterior mediastinum worrisome for recurrence of thymic carcinoma.    Nodular lesion in the left lower lobe with a tiny nodule in the right lower lobe, suspicious for possible metastatic lesions. 3  Patient is just over a 2  years post surgical resection of thymic carcinoma with followup radiation therapy. She returns today for followup CT scan of the chest suggests possibility of recurrence of thymic carcinoma. I discussed these findings with the patient and have arranged for a PET scan to be done and we'll see her back next week following the scan.   Delight Ovens MD 07/26/2012 11:17 AM

## 2012-08-03 ENCOUNTER — Other Ambulatory Visit (HOSPITAL_COMMUNITY): Payer: Medicare Other

## 2012-08-08 ENCOUNTER — Encounter (HOSPITAL_COMMUNITY)
Admission: RE | Admit: 2012-08-08 | Discharge: 2012-08-08 | Disposition: A | Discharge status: Home / Self Care | Payer: Medicare Other | Source: Ambulatory Visit | Attending: Cardiothoracic Surgery | Admitting: Cardiothoracic Surgery

## 2012-08-08 DIAGNOSIS — C37 Malignant neoplasm of thymus: Secondary | ICD-10-CM | POA: Insufficient documentation

## 2012-08-08 DIAGNOSIS — R918 Other nonspecific abnormal finding of lung field: Secondary | ICD-10-CM | POA: Insufficient documentation

## 2012-08-08 DIAGNOSIS — Z85118 Personal history of other malignant neoplasm of bronchus and lung: Secondary | ICD-10-CM | POA: Insufficient documentation

## 2012-08-08 DIAGNOSIS — Z923 Personal history of irradiation: Secondary | ICD-10-CM | POA: Insufficient documentation

## 2012-08-08 LAB — GLUCOSE, CAPILLARY: Glucose-Capillary: 90 mg/dL (ref 70–99)

## 2012-08-08 MED ORDER — FLUDEOXYGLUCOSE F - 18 (FDG) INJECTION
17.0000 | Freq: Once | INTRAVENOUS | Status: AC | PRN
  Administered 2012-08-08: 17 via INTRAVENOUS

## 2012-08-09 ENCOUNTER — Encounter: Payer: Self-pay | Admitting: Cardiothoracic Surgery

## 2012-08-09 ENCOUNTER — Ambulatory Visit (INDEPENDENT_AMBULATORY_CARE_PROVIDER_SITE_OTHER): Payer: Medicare Other | Admitting: Cardiothoracic Surgery

## 2012-08-09 VITALS — BP 97/64 | HR 96 | Resp 20 | Ht 66.0 in | Wt 110.0 lb

## 2012-08-09 DIAGNOSIS — C37 Malignant neoplasm of thymus: Secondary | ICD-10-CM

## 2012-08-09 DIAGNOSIS — R918 Other nonspecific abnormal finding of lung field: Secondary | ICD-10-CM

## 2012-08-09 NOTE — Patient Instructions (Signed)
Return for repeat ct of chest 8 months

## 2012-08-09 NOTE — Progress Notes (Signed)
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 She continues to be somewhat limited with her COPD and symptoms of shortness of breath but remains functional caring for her mother and is not on home oxygen. Patient was seen with followup routine CT scan several weeks ago and was sent to obtain a PET scan. She returns today to review the results and make further treatment plans.  History  Smoking status  . Former Smoker -- 0.80 packs/day for 47 years  . Types: Cigarettes  . Quit date: 06/20/2008  Smokeless tobacco  . Not on file       No Known Allergies  Current Outpatient Prescriptions  Medication Sig Dispense Refill  . albuterol (PROAIR HFA) 108 (90 BASE) MCG/ACT inhaler Inhale 2 puffs into the lungs every 6 (six) hours as needed.  1 Inhaler  6  . budesonide-formoterol (SYMBICORT) 160-4.5 MCG/ACT inhaler Inhale 2 puffs into the lungs 2 (two) times daily.  1 Inhaler  6  . clonazePAM (KLONOPIN) 0.5 MG tablet Take 0.5 mg by mouth 2 (two) times daily as needed.        . gabapentin (NEURONTIN) 300 MG capsule Take 300 mg by mouth 3 (three) times daily.       . Timolol Maleate 0.5 % (DAILY) SOLN Apply to eye. 1drop in each eye twice daily      . tiotropium (SPIRIVA HANDIHALER) 18 MCG inhalation capsule Place 1 capsule (18 mcg total) into inhaler and inhale daily.  30 capsule  6  . traMADol (ULTRAM) 50 MG  tablet Take 50 mg by mouth every 6 (six) hours as needed.       . zolpidem (AMBIEN) 10 MG tablet Take 10 mg by mouth at bedtime as needed.       No current facility-administered medications for this visit.       Physical Exam: BP 97/64  Pulse 96  Resp 20  Ht 5\' 6"  (1.676 m)  Wt 110 lb (49.896 kg)  BMI 17.76 kg/m2  SpO2 95%  General appearance: alert, cooperative, appears older than stated age and cachectic Neurologic: intact Heart: regular rate and rhythm, S1, S2 normal, no murmur, click, rub or gallop Lungs: clear to auscultation bilaterally Abdomen: soft, non-tender; bowel sounds normal; no masses,  no organomegaly Wound: The sternal incision is stable and well healed I do not appreciate any cervical or supraclavicular adenopathy Pigment changes around the right anterior chest and posterior chest which the patient notes developed after the shingles.  Diagnostic Studies & Laboratory data:         Recent Radiology Findings: Ct Chest Wo Contrast  07/26/2012  *RADIOLOGY REPORT*  Clinical Data: History of thymic carcinoma with resection, evaluate for recurrence, former smoking history  CT CHEST WITHOUT CONTRAST  Technique:  Multidetector CT imaging of the chest was performed following the standard protocol without IV contrast.  Comparison: CT chest of 07/28/2011  Findings: On soft tissue window images, there is new soft tissue within the anterior mediastinum, anterior and just to the left of the ascending aorta.  This does not appear to be typical of scarring, and this soft tissue is worrisome for recurrence of thymic carcinoma.  No mediastinal or hilar adenopathy is seen. There is calcification in the region of the left anterior descending coronary artery.  No abnormality of the upper abdomen is seen.  A left lower thyroid nodule again is noted.  On the lung window images, emphysematous changes are present consistent with centrilobular emphysema. There is some strandiness within the  paramediastinal lung primarily to the left midline anteriorly.  Some of this strandiness could be due to radiation fibrosis in the proper clinical setting.  However, there is a new nodular lesion within the left lower lobe superior segment of 9 mm in diameter. This will be unusual for metastatic lesion and could represent a developing primary lung carcinoma versus an atypical metastatic focus.   In addition a 3 mm nodular opacity is present in the right lower lobe which appears new as well and may represent a metastatic lesion.  No pleural effusion is seen.  Median sternotomy sutures are noted.  The central airway is patent.  IMPRESSION:  1.  New soft tissue within the anterior mediastinum worrisome for recurrence of thymic carcinoma. PET-CT may be helpful for further assessment if warranted clinically. 2.  Nodular lesion in the left lower lobe with a tiny nodule in the right lower lobe, suspicious for possible metastatic lesions. 3.  Linear markings paramediastinally and anteriorly may represent radiation fibrosis in the proper clinical setting but clinical correlation is recommended. 4.  Centrilobular emphysema. 5.  Stable low attenuation nodule in the left lobe of thyroid.   Original Report Authenticated By: Dwyane Dee, M.D.      Nm Pet Image Restag (ps) Skull Base To Thigh  08/08/2012  *RADIOLOGY REPORT*  Clinical Data:  Subsequent treatment strategy for thymic carcinoma. The patient is just over 2 years from surgical resection of lung carcinoma with follow-up radiation therapy.  NUCLEAR MEDICINE PET WHOLE BODY  Fasting Blood Glucose:  90  Technique:  17 mCi F-18 FDG was injected intravenously. CT data was obtained and used for attenuation correction and anatomic localization only.  (This was not acquired as a diagnostic CT examination.) Additional exam technical data entered on technologist worksheet.  Comparison:  Diagnostic chest CT from 07/26/2012.  PET CT from 05/04/2010  Findings:  Head/Neck:  There is  no evidence for abnormal F D G accumulation within the neck.  Chest:  2.4 x 1.5 cm focus of soft tissue attenuation in the anterior mediastinum has been progressive over multiple previous chest CTs, but shows no evidence for hypermetabolism on today's exam.  The patient's previous carcinoma evaluated on the previous PET CT was hypermetabolic.  Too small lung nodules were identified on the recent diagnostic CT chest visible on image 104 of series 2.  The second is a tiny 2-3 mm nodule in the central right lower lobe visible on image 106 of series 2.  There is no evidence for hypermetabolic F D G uptake in either nodule.  Abdomen/Pelvis:  No abnormal hypermetabolic activity within the liver, pancreas,  adrenal glands, or spleen.  No hypermetabolic lymph nodes in the abdomen or pelvis.  Skeleton:  No focal hypermetabolic activity to suggest skeletal metastasis.  Extremities:  No hypermetabolic activity to suggest metastasis.  IMPRESSION: Soft tissue focus identified in the anterior mediastinum was present 3 months postoperatively on 08/30/2010 (when it measured 1.6 x 0.6 cm) and has slowly increased in size over the 2-year interval since that time (now measuring 2.4 x 1.5 cm). There is no evidence for hypermetabolism within this soft tissue focus on today's PET scan although the original thymic carcinoma was hypermetabolic.  While the lack of hypermetabolism is reassuring, the slow progression of the soft tissue is somewhat problematic as continued continued progression of postoperative scarring would not be expected.  Tiny bilateral lung nodules show no hypermetabolic F D G uptake, but this size of these nodules is below the accepted threshold for reproducible resolution on PET imaging.  Low grade or well differentiated neoplasm may be poorly FDG avid and occult on PET imaging, especially at this size.   Original Report Authenticated By: Kennith Center, M.D.    Recent Labs: Lab Results  Component Value Date   WBC  10.5* 08/27/2010   HGB 10.2* 08/27/2010   HCT 31.2* 08/27/2010   PLT 441* 08/27/2010   GLUCOSE 95 08/27/2010   CHOL  Value: 173        ATP III CLASSIFICATION:  <200     mg/dL   Desirable  161-096  mg/dL   Borderline High  >=045    mg/dL   High        40/98/1191   TRIG 251* 04/30/2010   HDL 26* 04/30/2010   LDLCALC  Value: 97        Total Cholesterol/HDL:CHD Risk Coronary Heart Disease Risk Table                     Men   Women  1/2 Average Risk   3.4   3.3  Average Risk       5.0   4.4  2 X Average Risk   9.6   7.1  3 X Average Risk  23.4   11.0        Use the calculated Patient Ratio above and the CHD Risk Table to determine the patient's CHD Risk.        ATP III CLASSIFICATION (LDL):  <100     mg/dL   Optimal  478-295  mg/dL   Near or Above                    Optimal  130-159  mg/dL   Borderline  621-308  mg/dL   High  >657     mg/dL   Very High 84/69/6295   ALT 11 07/20/2010   AST 26 07/20/2010   NA 138 08/27/2010   K 3.3* 08/27/2010   CL 104 08/27/2010   CREATININE 2.35* 08/27/2010   BUN 15 08/27/2010   CO2 21 08/27/2010   TSH 3.333 07/23/2010   INR 1.28 05/09/2010      Assessment / Plan:    Will continue to monitor the anterior mediastinum with follow up CT of chest 8 months, current area noted is not hypermetabolic. Discussed finding of PET scan with patient Delight Ovens MD 08/09/2012 10:14 AM

## 2012-08-22 ENCOUNTER — Other Ambulatory Visit: Payer: Self-pay | Admitting: Pulmonary Disease

## 2012-08-24 ENCOUNTER — Ambulatory Visit: Payer: Medicare Other | Admitting: Pulmonary Disease

## 2012-09-05 ENCOUNTER — Ambulatory Visit: Payer: Medicare Other | Admitting: Pulmonary Disease

## 2012-09-19 ENCOUNTER — Encounter: Payer: Self-pay | Admitting: Pulmonary Disease

## 2012-09-19 ENCOUNTER — Ambulatory Visit (INDEPENDENT_AMBULATORY_CARE_PROVIDER_SITE_OTHER): Payer: Medicare Other | Admitting: Pulmonary Disease

## 2012-09-19 VITALS — BP 108/64 | HR 99 | Temp 98.6°F | Ht 66.0 in | Wt 119.6 lb

## 2012-09-19 DIAGNOSIS — J439 Emphysema, unspecified: Secondary | ICD-10-CM

## 2012-09-19 DIAGNOSIS — J438 Other emphysema: Secondary | ICD-10-CM

## 2012-09-19 MED ORDER — ALBUTEROL SULFATE HFA 108 (90 BASE) MCG/ACT IN AERS: 2.0000 | INHALATION_SPRAY | Freq: Four times a day (QID) | RESPIRATORY_TRACT | Status: AC | PRN

## 2012-09-19 MED ORDER — BUDESONIDE-FORMOTEROL FUMARATE 160-4.5 MCG/ACT IN AERO: 2.0000 | INHALATION_SPRAY | Freq: Two times a day (BID) | RESPIRATORY_TRACT | Status: DC

## 2012-09-19 MED ORDER — TIOTROPIUM BROMIDE MONOHYDRATE 18 MCG IN CAPS: 18.0000 ug | ORAL_CAPSULE | Freq: Every day | RESPIRATORY_TRACT | Status: DC

## 2012-09-19 NOTE — Patient Instructions (Addendum)
No change in your breathing medications Work on staying active, some type of conditioning program followup with me again in 6mos.

## 2012-09-19 NOTE — Addendum Note (Signed)
Addended by: Nita Sells on: 09/19/2012 02:03 PM   Modules accepted: Orders

## 2012-09-19 NOTE — Assessment & Plan Note (Signed)
The patient has been stable since her last visit 6 months ago, and has not had a chest infection or an acute exacerbation.  I've asked her to continue on her current bronchodilator regimen, and most importantly to get on some type of conditioning program.  If she is doing well, I would like to see her back in 6 months.

## 2012-09-19 NOTE — Progress Notes (Signed)
  Subjective:    Patient ID: [name], female    DOB: 1941/05/24, 72 y.o.   MRN: [id number]  HPI The patient comes in today for followup of her known COPD.  She has been staying on her bronchodilator regimen compliantly, and has not had an acute exacerbation or pulmonary infection since the last visit.  She feels that her breathing is at her usual baseline, and denies any significant chest congestion her mucus.   Review of Systems  Constitutional: Negative for fever and unexpected weight change.  HENT: Negative for ear pain, nosebleeds, congestion, sore throat, rhinorrhea, sneezing, trouble swallowing, dental problem, postnasal drip and sinus pressure.   Eyes: Negative for redness and itching.  Respiratory: Negative for cough, chest tightness, shortness of breath and wheezing.   Cardiovascular: Negative for palpitations and leg swelling.  Gastrointestinal: Negative for nausea and vomiting.  Genitourinary: Negative for dysuria.  Musculoskeletal: Negative for joint swelling.  Skin: Negative for rash.  Neurological: Negative for headaches.  Hematological: Does not bruise/bleed easily.  Psychiatric/Behavioral: Negative for dysphoric mood. The patient is not nervous/anxious.        Objective:   Physical Exam Thin female in no acute distress  Nose without purulence or discharge noted Neck without lymphadenopathy or thyromegaly. Chest with decreased breath sounds, but no wheezing Cardiac exam with regular rate and rhythm Lower extremities without edema, no cyanosis Alert and oriented, moves all 4 extremities.       Assessment & Plan:

## 2012-10-25 ENCOUNTER — Telehealth: Payer: Self-pay | Admitting: Pulmonary Disease

## 2012-10-25 MED ORDER — TIOTROPIUM BROMIDE MONOHYDRATE 18 MCG IN CAPS: 18.0000 ug | ORAL_CAPSULE | Freq: Every day | RESPIRATORY_TRACT | Status: DC

## 2012-10-25 NOTE — Telephone Encounter (Signed)
Pt called for refills of SPIRIVA not gabapentin---pt got her Dr offices mixed up when she called for refills. Refills of Spiriva have been sent to [facility] [address]. Nothing further needed.

## 2012-10-25 NOTE — Telephone Encounter (Signed)
This has already been handled----spiriva was refilled NOT gabapentin. Pt got her rx mixed up when she called.

## 2013-02-12 ENCOUNTER — Other Ambulatory Visit: Payer: Self-pay | Admitting: *Deleted

## 2013-02-12 MED ORDER — ESTAZOLAM 2 MG PO TABS: 2.0000 mg | ORAL_TABLET | Freq: Every day | ORAL | Status: AC

## 2013-03-15 ENCOUNTER — Other Ambulatory Visit: Payer: Self-pay | Admitting: *Deleted

## 2013-03-15 DIAGNOSIS — R918 Other nonspecific abnormal finding of lung field: Secondary | ICD-10-CM

## 2013-03-20 ENCOUNTER — Ambulatory Visit (INDEPENDENT_AMBULATORY_CARE_PROVIDER_SITE_OTHER): Payer: Medicare Other | Admitting: Pulmonary Disease

## 2013-03-20 ENCOUNTER — Encounter: Payer: Self-pay | Admitting: Pulmonary Disease

## 2013-03-20 VITALS — BP 88/60 | HR 98 | Temp 97.4°F | Ht 66.0 in | Wt 126.4 lb

## 2013-03-20 DIAGNOSIS — J438 Other emphysema: Secondary | ICD-10-CM

## 2013-03-20 DIAGNOSIS — J439 Emphysema, unspecified: Secondary | ICD-10-CM

## 2013-03-20 NOTE — Assessment & Plan Note (Signed)
The patient appears to be fairly stable from a COPD standpoint.  She is maintaining on her bronchodilator regimen, and has had stable exertional tolerance until most recently when she has needed to go up and downstairs.  She is in no distress, and has no bronchospasm on exam.  I would continue her on her current medication, but she is to call if her symptoms worsen.

## 2013-03-20 NOTE — Patient Instructions (Addendum)
No change in your breathing medications. Continue to stay active followup with me in 6mos, but call if you feel your breathing is worsening.

## 2013-03-20 NOTE — Progress Notes (Signed)
  Subjective:    Patient ID: [name], female    DOB: September 24, 1940, 72 y.o.   MRN: [id number]  HPI The patient comes in today for followup of her known COPD.  She is staying compliant on her bronchodilators, and has stayed fairly stable through the summer with a sedentary lifestyle.  Most recently, she has been caring for her ill mother, and this has required her going up and down stairs frequently.  This has been typical for, and leads to a feeling of worsening shortness of breath.  She does much better on flat ground.  She denies any significant cough, mucus production, fevers, or chills.   Review of Systems  Constitutional: Negative for fever and unexpected weight change.  HENT: Negative for ear pain, nosebleeds, congestion, sore throat, rhinorrhea, sneezing, trouble swallowing, dental problem, postnasal drip and sinus pressure.   Eyes: Negative for redness and itching.  Respiratory: Positive for chest tightness and shortness of breath. Negative for cough and wheezing.   Cardiovascular: Negative for palpitations and leg swelling.  Gastrointestinal: Negative for nausea and vomiting.  Genitourinary: Negative for dysuria.  Musculoskeletal: Negative for joint swelling.  Skin: Negative for rash.  Neurological: Negative for headaches.  Hematological: Does not bruise/bleed easily.  Psychiatric/Behavioral: Negative for dysphoric mood. The patient is not nervous/anxious.        Objective:   Physical Exam Thin female in no acute distress Nose without purulence or discharge noted Neck without lymphadenopathy or thyromegaly Chest with very diminished breath sounds throughout, no wheezing Cardiac exam with regular rate and rhythm Lower extremities without edema, no cyanosis Alert and oriented, moves all 4 extremities.       Assessment & Plan:

## 2013-03-25 ENCOUNTER — Other Ambulatory Visit: Payer: Self-pay | Admitting: *Deleted

## 2013-03-25 MED ORDER — TEMAZEPAM 15 MG PO CAPS: 15.0000 mg | ORAL_CAPSULE | Freq: Every evening | ORAL | Status: AC | PRN

## 2013-04-18 ENCOUNTER — Other Ambulatory Visit: Payer: Medicare Other

## 2013-04-18 ENCOUNTER — Ambulatory Visit: Payer: Medicare Other | Admitting: Cardiothoracic Surgery

## 2013-05-20 ENCOUNTER — Other Ambulatory Visit: Payer: Self-pay | Admitting: *Deleted

## 2013-05-20 MED ORDER — GABAPENTIN 300 MG PO CAPS: 300.0000 mg | ORAL_CAPSULE | Freq: Three times a day (TID) | ORAL | Status: AC

## 2013-05-20 MED ORDER — TRAMADOL HCL 50 MG PO TABS: ORAL_TABLET | ORAL | Status: AC

## 2013-10-01 ENCOUNTER — Other Ambulatory Visit: Payer: Self-pay | Admitting: Pulmonary Disease

## 2013-10-09 ENCOUNTER — Ambulatory Visit: Payer: Medicare Other | Admitting: Pulmonary Disease

## 2013-10-22 ENCOUNTER — Encounter: Payer: Self-pay | Admitting: *Deleted

## 2013-10-22 ENCOUNTER — Encounter: Payer: Self-pay | Admitting: Pulmonary Disease

## 2013-10-22 ENCOUNTER — Ambulatory Visit (INDEPENDENT_AMBULATORY_CARE_PROVIDER_SITE_OTHER): Payer: Medicare Other | Admitting: Pulmonary Disease

## 2013-10-22 VITALS — BP 112/80 | HR 110 | Temp 97.6°F | Ht 66.0 in | Wt 124.2 lb

## 2013-10-22 DIAGNOSIS — J438 Other emphysema: Secondary | ICD-10-CM

## 2013-10-22 DIAGNOSIS — J439 Emphysema, unspecified: Secondary | ICD-10-CM

## 2013-10-22 NOTE — Assessment & Plan Note (Signed)
The pt is stable from a pulmonary standpoint, and I have asked her to stay on her current meds.  I have also stressed the importance of daily exercise, and the value of pushing calories to improve muscle mass.

## 2013-10-22 NOTE — Progress Notes (Signed)
   Subjective:    Patient ID: [name], female    DOB: 1940/11/06, 73 y.o.   MRN: [id number]  HPI The pt comes in today for f/u of her known copd.  She is maintaining on her BD regimen, and feels it has continued to help her.  She has not had any recent acute exacerbation or pulmonary infection.  She is trying to stay as active as possible.    Review of Systems  Constitutional: Negative for fever and unexpected weight change.  HENT: Negative for congestion, dental problem, ear pain, nosebleeds, postnasal drip, rhinorrhea, sinus pressure, sneezing, sore throat and trouble swallowing.   Eyes: Negative for redness and itching.  Respiratory: Positive for cough and shortness of breath. Negative for chest tightness and wheezing.   Cardiovascular: Negative for palpitations and leg swelling.  Gastrointestinal: Negative for nausea and vomiting.  Genitourinary: Negative for dysuria.  Musculoskeletal: Negative for joint swelling.  Skin: Negative for rash.  Neurological: Negative for headaches.  Hematological: Does not bruise/bleed easily.  Psychiatric/Behavioral: Negative for dysphoric mood. The patient is not nervous/anxious.        Objective:   Physical Exam Thin female in nad Nose without purulence or d/c noted. Neck without LN or TMG Chest with decreased bs, no active wheezing Cor with rrr LE without edema, no cyanosis. Alert and oriented, moves all 4.       Assessment & Plan:

## 2013-10-22 NOTE — Patient Instructions (Signed)
No change in current medications Stay as active as possible. followup with me again in 6mos.  

## 2013-12-17 ENCOUNTER — Other Ambulatory Visit: Payer: Self-pay | Admitting: Pulmonary Disease

## 2014-01-27 ENCOUNTER — Other Ambulatory Visit: Payer: Self-pay | Admitting: Pulmonary Disease

## 2014-04-25 ENCOUNTER — Ambulatory Visit: Payer: Medicare Other | Admitting: Pulmonary Disease

## 2014-05-08 ENCOUNTER — Ambulatory Visit: Payer: Medicare Other | Admitting: Pulmonary Disease

## 2014-05-09 ENCOUNTER — Ambulatory Visit: Payer: Medicare Other | Admitting: Pulmonary Disease

## 2014-05-26 ENCOUNTER — Ambulatory Visit: Payer: Medicare Other | Admitting: Pulmonary Disease

## 2014-05-27 ENCOUNTER — Ambulatory Visit: Payer: Medicare Other | Admitting: Pulmonary Disease

## 2014-05-29 ENCOUNTER — Ambulatory Visit: Payer: Medicare Other | Admitting: Pulmonary Disease

## 2014-06-02 ENCOUNTER — Ambulatory Visit: Payer: Medicare Other | Admitting: Pulmonary Disease

## 2014-06-09 ENCOUNTER — Ambulatory Visit: Payer: Medicare Other | Admitting: Pulmonary Disease

## 2014-06-26 ENCOUNTER — Ambulatory Visit: Payer: Medicare Other | Admitting: Pulmonary Disease

## 2014-08-05 ENCOUNTER — Telehealth: Payer: Self-pay

## 2014-08-05 NOTE — Telephone Encounter (Signed)
Rec'd Death Certificate for Dr. Gwenette Greet to sign from [facility] 08/05/2014.  Dr. Gwenette Greet refused to sign.

## 2014-08-19 DEATH — deceased

## 2014-08-22 NOTE — Telephone Encounter (Signed)
Wilfrid Lund from [facility] called to give certificate back to Dr. Gwenette Greet per [facility]) request. Dr. Gwenette Greet out of office, provided copies and explained to [facility]. Will revisit tomorrow 08/22/14, rmf.

## 2014-08-28 NOTE — Telephone Encounter (Signed)
Handwritten note from Wynona Canes, MD- Medical Examiner and death certificate presented to Cleotilde Neer to present to Dr. Gwenette Greet.  Please call Keturah Shavers at [phone] when certificate is signed-do not mail, rmf.

## 2014-09-02 NOTE — Telephone Encounter (Signed)
[id number] Certificate signed by Chesley Mires, [place] notified of completion, rmf. S[facility]P opened.
